# Patient Record
Sex: Female | Born: 2000 | Race: White | Hispanic: Yes | Smoking: Never smoker
Health system: Southern US, Community
[De-identification: ages and names within clinical notes are randomized; demographics above are authoritative.]

## PROBLEM LIST (undated history)

## (undated) ENCOUNTER — Inpatient Hospital Stay (HOSPITAL_COMMUNITY): Payer: Self-pay

---
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
## 2020-03-02 ENCOUNTER — Encounter (HOSPITAL_COMMUNITY): Payer: Self-pay | Admitting: Urgent Care

## 2020-03-02 ENCOUNTER — Ambulatory Visit (HOSPITAL_COMMUNITY)
Admission: EM | Admit: 2020-03-02 | Discharge: 2020-03-02 | Disposition: A | Discharge status: Home / Self Care | Payer: Medicaid Other | Attending: Urgent Care | Admitting: Urgent Care

## 2020-03-02 DIAGNOSIS — N390 Urinary tract infection, site not specified: Secondary | ICD-10-CM

## 2020-03-02 DIAGNOSIS — N3001 Acute cystitis with hematuria: Secondary | ICD-10-CM | POA: Diagnosis not present

## 2020-03-02 DIAGNOSIS — R300 Dysuria: Secondary | ICD-10-CM | POA: Diagnosis present

## 2020-03-02 DIAGNOSIS — Z3202 Encounter for pregnancy test, result negative: Secondary | ICD-10-CM | POA: Diagnosis not present

## 2020-03-02 DIAGNOSIS — R1030 Lower abdominal pain, unspecified: Secondary | ICD-10-CM | POA: Insufficient documentation

## 2020-03-02 LAB — POCT URINALYSIS DIPSTICK, ED / UC
Glucose, UA: NEGATIVE mg/dL
Ketones, ur: 40 mg/dL — AB
Nitrite: NEGATIVE
Protein, ur: >=300 mg/dL — AB
Specific Gravity, Urine: 1.025 (ref 1.005–1.030)
Urobilinogen, UA: 4 mg/dL — ABNORMAL HIGH (ref 0.0–1.0)
pH: 7 (ref 5.0–8.0)

## 2020-03-02 LAB — POC URINE PREG, ED
Preg Test, Ur: NEGATIVE
Preg Test, Ur: NEGATIVE

## 2020-03-02 MED ORDER — NITROFURANTOIN MONOHYD MACRO 100 MG PO CAPS: 100.0000 mg | ORAL_CAPSULE | Freq: Two times a day (BID) | ORAL | 0 refills | Status: DC

## 2020-03-02 NOTE — ED Provider Notes (Signed)
[facility]   MRN: [id number] DOB: 2000/07/15  Subjective:   Jasmine Walters is a 19 y.o. female presenting for 2-day history of acute onset dysuria, urinary frequency, intermittent lower abdominal pain.  Patient would like to be checked for STIs as well.  Admits that she does not hydrate very well, drinks sodas as well.  States that she cleans her vagina internally using her finger.  Denies taking chronic medications.    No Known Allergies  History reviewed. No pertinent past medical history.   History reviewed. No pertinent surgical history.  Family History  Problem Relation Age of Onset  . Healthy Mother   . Healthy Father     Social History   Tobacco Use  . Smoking status: Former Smoker    Types: Cigars  . Smokeless tobacco: Never Used  Substance Use Topics  . Alcohol use: Yes    Comment: occ  . Drug use: Yes    Types: Marijuana    ROS   Objective:   Vitals: BP (!) 96/56   Pulse (!) 59   Temp 98.6 F (37 C) (Oral)   Resp 16   Ht 4\' 11"  (1.499 m)   Wt 98 lb (44.5 kg)   SpO2 100%   BMI 19.79 kg/m   Physical Exam Constitutional:      General: She is not in acute distress.    Appearance: Normal appearance. She is well-developed. She is not ill-appearing, toxic-appearing or diaphoretic.  HENT:     Head: Normocephalic and atraumatic.     Nose: Nose normal.     Mouth/Throat:     Mouth: Mucous membranes are moist.     Pharynx: Oropharynx is clear.  Eyes:     General: No scleral icterus.       Right eye: No discharge.        Left eye: No discharge.     Extraocular Movements: Extraocular movements intact.     Conjunctiva/sclera: Conjunctivae normal.     Pupils: Pupils are equal, round, and reactive to light.  Cardiovascular:     Rate and Rhythm: Normal rate.  Pulmonary:     Effort: Pulmonary effort is normal.  Abdominal:     General: Bowel sounds are normal. There is no distension.     Palpations: Abdomen is soft. There is no  mass.     Tenderness: There is abdominal tenderness (Mild, generalized, throughout). There is no right CVA tenderness, left CVA tenderness, guarding or rebound.  Skin:    General: Skin is warm and dry.  Neurological:     General: No focal deficit present.     Mental Status: She is alert and oriented to person, place, and time.  Psychiatric:        Mood and Affect: Mood normal.        Behavior: Behavior normal.        Thought Content: Thought content normal.        Judgment: Judgment normal.     Results for orders placed or performed during the hospital encounter of 03/02/20 (from the past 24 hour(s))  POC Urinalysis dipstick     Status: Abnormal   Collection Time: 03/02/20  3:52 PM  Result Value Ref Range   Glucose, UA NEGATIVE NEGATIVE mg/dL   Bilirubin Urine MODERATE (A) NEGATIVE   Ketones, ur 40 (A) NEGATIVE mg/dL   Specific Gravity, Urine 1.025 1.005 - 1.030   Hgb urine dipstick LARGE (A) NEGATIVE   pH 7.0 5.0 -  8.0   Protein, ur >=300 (A) NEGATIVE mg/dL   Urobilinogen, UA 4.0 (H) 0.0 - 1.0 mg/dL   Nitrite NEGATIVE NEGATIVE   Leukocytes,Ua LARGE (A) NEGATIVE  POC urine pregnancy     Status: None   Collection Time: 03/02/20  3:54 PM  Result Value Ref Range   Preg Test, Ur Negative Negative    Assessment and Plan :   PDMP not reviewed this encounter.  1. Acute cystitis with hematuria   2. Dysuria   3. Lower abdominal pain     Start Macrobid to cover for acute cystitis, urine culture, STI testing pending.  Discussed appropriate hygiene for the genital area.  Recommended aggressive hydration, limiting urinary irritants. Counseled patient on potential for adverse effects with medications prescribed/recommended today, ER and return-to-clinic precautions discussed, patient verbalized understanding.    Wallis Bamberg, PA-C 03/02/20 1606

## 2020-03-02 NOTE — Discharge Instructions (Signed)
Make sure you hydrate very well with plain water and a quantity of 64 ounces of water a day.  Please limit drinks that are considered urinary irritants such as soda, sweet tea, coffee, energy drinks, alcohol.  These can worsen your UTI symptoms and also be the source of them.  I will let you know about your urine culture results through MyChart to see if we need to change your antibiotics based off of those results. ° °

## 2020-03-02 NOTE — ED Triage Notes (Signed)
Pt c/o dysuria, urine frequency, and 6/10 pain in lower abdomenx2 days

## 2020-03-03 LAB — URINE CULTURE: Culture: >=10000 — AB

## 2020-03-03 LAB — CERVICOVAGINAL ANCILLARY ONLY
Bacterial Vaginitis (gardnerella): POSITIVE — AB
Candida Glabrata: NEGATIVE
Candida Vaginitis: POSITIVE — AB
Chlamydia: NEGATIVE
Comment: NEGATIVE
Comment: NEGATIVE
Comment: NEGATIVE
Comment: NEGATIVE
Comment: NEGATIVE
Comment: NORMAL
Neisseria Gonorrhea: NEGATIVE
Trichomonas: NEGATIVE

## 2020-03-04 ENCOUNTER — Telehealth (HOSPITAL_COMMUNITY): Payer: Self-pay

## 2020-03-04 MED ORDER — FLUCONAZOLE 150 MG PO TABS
150.0000 mg | ORAL_TABLET | Freq: Every day | ORAL | 0 refills | Status: AC
Start: 2020-03-04 — End: 2020-03-06

## 2020-03-04 MED ORDER — METRONIDAZOLE 500 MG PO TABS
500.0000 mg | ORAL_TABLET | Freq: Two times a day (BID) | ORAL | 0 refills | Status: DC
Start: 2020-03-04 — End: 2020-10-08

## 2020-10-08 ENCOUNTER — Encounter (HOSPITAL_COMMUNITY): Payer: Self-pay | Admitting: Urgent Care

## 2020-10-08 ENCOUNTER — Ambulatory Visit (HOSPITAL_COMMUNITY)
Admission: EM | Admit: 2020-10-08 | Discharge: 2020-10-08 | Disposition: A | Discharge status: Home / Self Care | Payer: Medicaid Other | Attending: Urgent Care | Admitting: Urgent Care

## 2020-10-08 ENCOUNTER — Other Ambulatory Visit: Payer: Self-pay

## 2020-10-08 DIAGNOSIS — R300 Dysuria: Secondary | ICD-10-CM | POA: Diagnosis present

## 2020-10-08 DIAGNOSIS — N898 Other specified noninflammatory disorders of vagina: Secondary | ICD-10-CM | POA: Insufficient documentation

## 2020-10-08 DIAGNOSIS — N760 Acute vaginitis: Secondary | ICD-10-CM | POA: Insufficient documentation

## 2020-10-08 DIAGNOSIS — B9689 Other specified bacterial agents as the cause of diseases classified elsewhere: Secondary | ICD-10-CM | POA: Diagnosis present

## 2020-10-08 LAB — POCT URINALYSIS DIPSTICK, ED / UC
Bilirubin Urine: NEGATIVE
Glucose, UA: NEGATIVE mg/dL
Hgb urine dipstick: NEGATIVE
Ketones, ur: NEGATIVE mg/dL
Leukocytes,Ua: NEGATIVE
Nitrite: NEGATIVE
Protein, ur: NEGATIVE mg/dL
Specific Gravity, Urine: 1.025 (ref 1.005–1.030)
Urobilinogen, UA: 0.2 mg/dL (ref 0.0–1.0)
pH: 7 (ref 5.0–8.0)

## 2020-10-08 LAB — HIV ANTIBODY (ROUTINE TESTING W REFLEX): HIV Screen 4th Generation wRfx: NONREACTIVE

## 2020-10-08 LAB — RPR: RPR Ser Ql: NONREACTIVE

## 2020-10-08 LAB — POC URINE PREG, ED: Preg Test, Ur: NEGATIVE

## 2020-10-08 MED ORDER — PHENAZOPYRIDINE HCL 200 MG PO TABS
200.0000 mg | ORAL_TABLET | Freq: Three times a day (TID) | ORAL | 0 refills | Status: DC | PRN
Start: 2020-10-08 — End: 2020-10-19

## 2020-10-08 MED ORDER — METRONIDAZOLE 500 MG PO TABS: 500.0000 mg | ORAL_TABLET | Freq: Two times a day (BID) | ORAL | 0 refills | Status: DC

## 2020-10-08 NOTE — ED Triage Notes (Signed)
PT reports UTI  Sx's with dysuria and a Vag discharge with odor. Pt also wants blood work for HIV.

## 2020-10-08 NOTE — ED Provider Notes (Signed)
[facility]   MRN: [id number] DOB: [birth year]  Subjective:   Jasmine Walters is a 20 y.o. female presenting for several day history of recurrent dysuria, urinary frequency and malodorous vaginal discharge.  Denies fever, nausea, vomiting, abdominal pelvic pain, flank pain, hematuria.  Patient has a history of BV and yeast infections.  She is sexually active, has 1 female partner, does not use any kind of protection.  Would like to make sure she does not have any form of STI including HIV and syphilis.  No current facility-administered medications for this encounter.  Current Outpatient Medications:  .  metroNIDAZOLE (FLAGYL) 500 MG tablet, Take 1 tablet (500 mg total) by mouth 2 (two) times daily., Disp: 14 tablet, Rfl: 0 .  nitrofurantoin, macrocrystal-monohydrate, (MACROBID) 100 MG capsule, Take 1 capsule (100 mg total) by mouth 2 (two) times daily., Disp: 14 capsule, Rfl: 0   No Known Allergies  History reviewed. No pertinent past medical history.   History reviewed. No pertinent surgical history.  Family History  Problem Relation Age of Onset  . Healthy Mother   . Healthy Father     Social History   Tobacco Use  . Smoking status: Former Smoker    Types: Cigars  . Smokeless tobacco: Never Used  Substance Use Topics  . Alcohol use: Yes    Comment: occ  . Drug use: Yes    Types: Marijuana    ROS   Objective:   Vitals: BP 106/62 (BP Location: Right Arm)   Pulse 63   Temp 98.5 F (36.9 C) (Oral)   Resp 16   LMP 09/19/2020   SpO2 100%   Physical Exam Constitutional:      General: She is not in acute distress.    Appearance: Normal appearance. She is well-developed. She is not ill-appearing, toxic-appearing or diaphoretic.  HENT:     Head: Normocephalic and atraumatic.     Nose: Nose normal.     Mouth/Throat:     Mouth: Mucous membranes are moist.     Pharynx: Oropharynx is clear.  Eyes:     General: No scleral icterus.    Extraocular  Movements: Extraocular movements intact.     Pupils: Pupils are equal, round, and reactive to light.  Cardiovascular:     Rate and Rhythm: Normal rate.  Pulmonary:     Effort: Pulmonary effort is normal.  Abdominal:     General: Bowel sounds are normal. There is no distension.     Palpations: Abdomen is soft. There is no mass.     Tenderness: There is no abdominal tenderness. There is no right CVA tenderness, left CVA tenderness, guarding or rebound.  Skin:    General: Skin is warm and dry.  Neurological:     General: No focal deficit present.     Mental Status: She is alert and oriented to person, place, and time.  Psychiatric:        Mood and Affect: Mood normal.        Behavior: Behavior normal.        Thought Content: Thought content normal.        Judgment: Judgment normal.     Results for orders placed or performed during the hospital encounter of 10/08/20 (from the past 24 hour(s))  POC Urinalysis dipstick     Status: None   Collection Time: 10/08/20  8:46 AM  Result Value Ref Range   Glucose, UA NEGATIVE NEGATIVE mg/dL   Bilirubin Urine NEGATIVE NEGATIVE  Ketones, ur NEGATIVE NEGATIVE mg/dL   Specific Gravity, Urine 1.025 1.005 - 1.030   Hgb urine dipstick NEGATIVE NEGATIVE   pH 7.0 5.0 - 8.0   Protein, ur NEGATIVE NEGATIVE mg/dL   Urobilinogen, UA 0.2 0.0 - 1.0 mg/dL   Nitrite NEGATIVE NEGATIVE   Leukocytes,Ua NEGATIVE NEGATIVE  POC urine pregnancy     Status: None   Collection Time: 10/08/20  8:50 AM  Result Value Ref Range   Preg Test, Ur NEGATIVE NEGATIVE    Assessment and Plan :   PDMP not reviewed this encounter.  1. BV (bacterial vaginosis)   2. Dysuria   3. Vaginal discharge     Patient has benign physical exam, reassuring vital signs.  Will cover empirically for recurrent bacterial vaginosis.  Encouraged safe sex practices, labs pending will treat as appropriate.  In the meantime, hydrate well with plain water, use Pyridium as needed, urine  culture pending. Counseled patient on potential for adverse effects with medications prescribed/recommended today, ER and return-to-clinic precautions discussed, patient verbalized understanding.    Wallis Bamberg, [place] 10/08/20 [phone]

## 2020-10-09 LAB — URINE CULTURE: Culture: NO GROWTH

## 2020-10-09 LAB — CERVICOVAGINAL ANCILLARY ONLY
Bacterial Vaginitis (gardnerella): POSITIVE — AB
Candida Glabrata: NEGATIVE
Candida Vaginitis: NEGATIVE
Chlamydia: NEGATIVE
Comment: NEGATIVE
Comment: NEGATIVE
Comment: NEGATIVE
Comment: NEGATIVE
Comment: NEGATIVE
Comment: NORMAL
Neisseria Gonorrhea: NEGATIVE
Trichomonas: NEGATIVE

## 2020-10-19 ENCOUNTER — Other Ambulatory Visit: Payer: Self-pay

## 2020-10-19 ENCOUNTER — Encounter (HOSPITAL_COMMUNITY): Payer: Self-pay | Admitting: Medical Oncology

## 2020-10-19 ENCOUNTER — Ambulatory Visit (HOSPITAL_COMMUNITY)
Admission: EM | Admit: 2020-10-19 | Discharge: 2020-10-19 | Disposition: A | Discharge status: Home / Self Care | Payer: Medicaid Other | Attending: Medical Oncology | Admitting: Medical Oncology

## 2020-10-19 DIAGNOSIS — R519 Headache, unspecified: Secondary | ICD-10-CM | POA: Insufficient documentation

## 2020-10-19 DIAGNOSIS — Z87891 Personal history of nicotine dependence: Secondary | ICD-10-CM | POA: Diagnosis not present

## 2020-10-19 DIAGNOSIS — R059 Cough, unspecified: Secondary | ICD-10-CM

## 2020-10-19 DIAGNOSIS — R0981 Nasal congestion: Secondary | ICD-10-CM | POA: Diagnosis not present

## 2020-10-19 DIAGNOSIS — R112 Nausea with vomiting, unspecified: Secondary | ICD-10-CM | POA: Diagnosis not present

## 2020-10-19 DIAGNOSIS — Z20822 Contact with and (suspected) exposure to covid-19: Secondary | ICD-10-CM | POA: Insufficient documentation

## 2020-10-19 MED ORDER — BENZONATATE 100 MG PO CAPS: 100.0000 mg | ORAL_CAPSULE | Freq: Three times a day (TID) | ORAL | 0 refills | Status: DC

## 2020-10-19 MED ORDER — FLUTICASONE PROPIONATE 50 MCG/ACT NA SUSP
2.0000 | Freq: Every day | NASAL | 0 refills | Status: DC
Start: 2020-10-19 — End: 2022-04-30

## 2020-10-19 MED ORDER — ONDANSETRON HCL 4 MG PO TABS: 4.0000 mg | ORAL_TABLET | Freq: Four times a day (QID) | ORAL | 0 refills | Status: DC

## 2020-10-19 NOTE — ED Triage Notes (Addendum)
Pt with headache, cough, mucus, body aches since yesterday. Rates pain in head 8/10. One episode of emesis. Has been using nyquil for sxs.

## 2020-10-19 NOTE — ED Provider Notes (Signed)
[facility]    CSN: [id number] Arrival date & time: 10/19/20  1537      History   Chief Complaint Chief Complaint  Patient presents with  . Headache  . Cough  . Nasal Congestion  . Generalized Body Aches    HPI Jasmine Walters is a 20 y.o. female.   HPI   Cold Symptoms: Patient reports with headache, cough, sinus congestion, body aches that started yesterday.  Headache is rated 8 out of 10 in nature.  They have had 1 episode of emesis which looks like her last meal.  She has been using NyQuil for symptoms with mild relief. She denies fevers, neck stiffness, double vision, visual changes, abd pain. LMC: Current and has female partner.   History reviewed. No pertinent past medical history.  There are no problems to display for this patient.   History reviewed. No pertinent surgical history.  OB History   No obstetric history on file.      Home Medications    Prior to Admission medications   Medication Sig Start Date End Date Taking? Authorizing Provider  metroNIDAZOLE (FLAGYL) 500 MG tablet Take 1 tablet (500 mg total) by mouth 2 (two) times daily. 10/08/20   Wallis Bamberg, PA-C  nitrofurantoin, macrocrystal-monohydrate, (MACROBID) 100 MG capsule Take 1 capsule (100 mg total) by mouth 2 (two) times daily. 03/02/20   Wallis Bamberg, PA-C  phenazopyridine (PYRIDIUM) 200 MG tablet Take 1 tablet (200 mg total) by mouth 3 (three) times daily as needed for pain. 10/08/20   Wallis Bamberg, PA-C  cetirizine (ZYRTEC ALLERGY) 10 MG tablet Take 1 tablet (10 mg total) by mouth daily. 10/22/19 03/02/20  Wallis Bamberg, PA-C  famotidine (PEPCID) 20 MG tablet Take bid for 3 days then as needed thereafter for heartburn 04/18/18 04/25/19  Ree Shay, MD  fluticasone (FLONASE) 50 MCG/ACT nasal spray Place 2 sprays into both nostrils daily. 05/31/16 04/25/19  Everlene Farrier, PA-C  omeprazole (PRILOSEC) 20 MG capsule Take 1 capsule (20 mg total) by mouth daily. 09/07/17 04/25/19  Antony Madura, PA-C     Family History Family History  Problem Relation Age of Onset  . Healthy Mother   . Healthy Father     Social History Social History   Tobacco Use  . Smoking status: Former Smoker    Types: Cigars  . Smokeless tobacco: Never Used  Substance Use Topics  . Alcohol use: Yes    Comment: occ  . Drug use: Yes    Types: Marijuana     Allergies   Patient has no known allergies.   Review of Systems Review of Systems  As stated above in HPI Physical Exam Triage Vital Signs ED Triage Vitals  Enc Vitals Group     BP 10/19/20 1630 100/62     Pulse Rate 10/19/20 1630 78     Resp 10/19/20 1630 18     Temp 10/19/20 1630 98.9 F (37.2 C)     Temp src --      SpO2 10/19/20 1630 98 %     Weight --      Height --      Head Circumference --      Peak Flow --      Pain Score 10/19/20 1627 8     Pain Loc --      Pain Edu? --      Excl. in GC? --    No data found.  Updated Vital Signs BP 100/62   Pulse 78  Temp 98.9 F (37.2 C)   Resp 18   LMP 10/17/2020   SpO2 98%   Physical Exam Vitals and nursing note reviewed.  Constitutional:      General: She is not in acute distress.    Appearance: She is well-developed. She is not ill-appearing, toxic-appearing or diaphoretic.  HENT:     Head: Normocephalic and atraumatic.     Mouth/Throat:     Mouth: Mucous membranes are moist.     Pharynx: Oropharynx is clear.  Eyes:     Extraocular Movements: Extraocular movements intact.     Right eye: Normal extraocular motion and no nystagmus.     Left eye: Normal extraocular motion and no nystagmus.     Pupils: Pupils are equal, round, and reactive to light.     Right eye: Pupil is round and reactive.     Left eye: Pupil is round and reactive.  Cardiovascular:     Rate and Rhythm: Normal rate and regular rhythm.     Heart sounds: Normal heart sounds.  Pulmonary:     Breath sounds: Normal breath sounds.  Abdominal:     Palpations: Abdomen is soft.  Musculoskeletal:      Cervical back: Normal range of motion and neck supple. No rigidity.  Lymphadenopathy:     Cervical: No cervical adenopathy.  Skin:    General: Skin is warm.     Coloration: Skin is not cyanotic.     Comments: NO jaundice  Neurological:     Mental Status: She is alert and oriented to person, place, and time.     Motor: No weakness.     Coordination: Coordination normal.     Gait: Gait normal.     Deep Tendon Reflexes: Reflexes normal.  Psychiatric:        Behavior: Behavior normal.      UC Treatments / Results  Labs (all labs ordered are listed, but only abnormal results are displayed) Labs Reviewed  SARS CORONAVIRUS 2 (TAT 6-24 HRS)    EKG   Radiology No results found.  Procedures Procedures (including critical care time)  Medications Ordered in UC Medications - No data to display  Initial Impression / Assessment and Plan / UC Course  I have reviewed the triage vital signs and the nursing notes.  Pertinent labs & imaging results that were available during my care of the patient were reviewed by me and considered in my medical decision making (see chart for details).     New.  Likely viral in nature.  COVID test pending.  Other precautions discussed.  For now we will treat with Flonase, Tessalon and Zofran.  Discussed red flag signs and symptoms.  Final Clinical Impressions(s) / UC Diagnoses   Final diagnoses:  None   Discharge Instructions   None    ED Prescriptions    None     PDMP not reviewed this encounter.   Rushie Chestnut, [place] 10/19/20 1645

## 2020-10-20 LAB — SARS CORONAVIRUS 2 (TAT 6-24 HRS): SARS Coronavirus 2: NEGATIVE

## 2020-10-23 ENCOUNTER — Encounter (HOSPITAL_COMMUNITY): Payer: Self-pay

## 2020-10-23 ENCOUNTER — Ambulatory Visit (HOSPITAL_COMMUNITY)
Admission: EM | Admit: 2020-10-23 | Discharge: 2020-10-23 | Disposition: A | Discharge status: Home / Self Care | Payer: Medicaid Other | Attending: Internal Medicine | Admitting: Internal Medicine

## 2020-10-23 ENCOUNTER — Other Ambulatory Visit: Payer: Self-pay

## 2020-10-23 DIAGNOSIS — J069 Acute upper respiratory infection, unspecified: Secondary | ICD-10-CM

## 2020-10-23 MED ORDER — GUAIFENESIN ER 600 MG PO TB12
600.0000 mg | ORAL_TABLET | Freq: Two times a day (BID) | ORAL | 0 refills | Status: DC | PRN
Start: 2020-10-23 — End: 2022-04-30

## 2020-10-23 NOTE — ED Triage Notes (Signed)
Pt c/o wheezing and cough. Pt states she has taken her medicine and states the medication is causing her to not eat.

## 2020-10-23 NOTE — Discharge Instructions (Addendum)
Take medications as tolerated No indication for steroids or inhalers at this time If symptoms worsen please return to the urgent care to be reevaluated.

## 2020-10-26 NOTE — ED Provider Notes (Signed)
[facility]    CSN: [id number] Arrival date & time: 10/23/20  1349      History   Chief Complaint Chief Complaint  Patient presents with  . Wheezing  . Cough    HPI Jasmine Walters is a 20 y.o. female comes to the urgent care with 1 day history of intermittent wheezing and coughing.  Symptoms started with some nasal congestion,.  No fever or chills.  No nausea or vomiting.  Appetite is decreased.  No sick contacts.   HPI  History reviewed. No pertinent past medical history.  There are no problems to display for this patient.   History reviewed. No pertinent surgical history.  OB History   No obstetric history on file.      Home Medications    Prior to Admission medications   Medication Sig Start Date End Date Taking? Authorizing Provider  guaiFENesin (MUCINEX) 600 MG 12 hr tablet Take 1 tablet (600 mg total) by mouth 2 (two) times daily as needed. 10/23/20  Yes Gracianna Vink, Britta Mccreedy, MD  benzonatate (TESSALON) 100 MG capsule Take 1 capsule (100 mg total) by mouth every 8 (eight) hours. 10/19/20   Rushie Chestnut, PA-C  fluticasone (FLONASE) 50 MCG/ACT nasal spray Place 2 sprays into both nostrils daily. 10/19/20   Rushie Chestnut, PA-C  ondansetron (ZOFRAN) 4 MG tablet Take 1 tablet (4 mg total) by mouth every 6 (six) hours. 10/19/20   Rushie Chestnut, PA-C  cetirizine (ZYRTEC ALLERGY) 10 MG tablet Take 1 tablet (10 mg total) by mouth daily. 10/22/19 03/02/20  Wallis Bamberg, PA-C  famotidine (PEPCID) 20 MG tablet Take bid for 3 days then as needed thereafter for heartburn 04/18/18 04/25/19  Ree Shay, MD  omeprazole (PRILOSEC) 20 MG capsule Take 1 capsule (20 mg total) by mouth daily. 09/07/17 04/25/19  Antony Madura, PA-C    Family History Family History  Problem Relation Age of Onset  . Healthy Mother   . Healthy Father     Social History Social History   Tobacco Use  . Smoking status: Former Smoker    Types: Cigars  . Smokeless tobacco: Never Used   Substance Use Topics  . Alcohol use: Yes    Comment: occ  . Drug use: Yes    Types: Marijuana     Allergies   Patient has no known allergies.   Review of Systems Review of Systems  Constitutional: Negative.   HENT: Positive for congestion and sore throat.   Respiratory: Positive for cough.   Gastrointestinal: Negative.  Negative for diarrhea, nausea and vomiting.     Physical Exam Triage Vital Signs ED Triage Vitals  Enc Vitals Group     BP 10/23/20 1400 103/65     Pulse Rate 10/23/20 1400 78     Resp 10/23/20 1400 18     Temp 10/23/20 1400 98.7 F (37.1 C)     Temp Source 10/23/20 1400 Oral     SpO2 10/23/20 1400 97 %     Weight --      Height --      Head Circumference --      Peak Flow --      Pain Score 10/23/20 1359 0     Pain Loc --      Pain Edu? --      Excl. in GC? --    No data found.  Updated Vital Signs BP 103/65 (BP Location: Right Arm)   Pulse 78   Temp 98.7 F (  37.1 C) (Oral)   Resp 18   LMP 10/17/2020   SpO2 97%   Visual Acuity Right Eye Distance:   Left Eye Distance:   Bilateral Distance:    Right Eye Near:   Left Eye Near:    Bilateral Near:     Physical Exam Vitals and nursing note reviewed.  Constitutional:      General: She is not in acute distress.    Appearance: She is not ill-appearing.  Cardiovascular:     Rate and Rhythm: Normal rate and regular rhythm.     Heart sounds: Normal heart sounds.  Pulmonary:     Effort: Pulmonary effort is normal.     Breath sounds: Normal breath sounds.      UC Treatments / Results  Labs (all labs ordered are listed, but only abnormal results are displayed) Labs Reviewed - No data to display  EKG   Radiology No results found.  Procedures Procedures (including critical care time)  Medications Ordered in UC Medications - No data to display  Initial Impression / Assessment and Plan / UC Course  I have reviewed the triage vital signs and the nursing notes.  Pertinent  labs & imaging results that were available during my care of the patient were reviewed by me and considered in my medical decision making (see chart for details).     1.  Viral respiratory illness with cough: Continue over-the-counter medications for cough Mucinex as needed Increase oral fluid intake Return to urgent care if symptoms persist.  Final Clinical Impressions(s) / UC Diagnoses   Final diagnoses:  Viral upper respiratory tract infection with cough     Discharge Instructions     Take medications as tolerated No indication for steroids or inhalers at this time If symptoms worsen please return to the urgent care to be reevaluated.   ED Prescriptions    Medication Sig Dispense Auth. Provider   guaiFENesin (MUCINEX) 600 MG 12 hr tablet Take 1 tablet (600 mg total) by mouth 2 (two) times daily as needed. 20 tablet Daxton Nydam, Britta Mccreedy, MD     PDMP not reviewed this encounter.   Merrilee Jansky, MD 10/26/20 [phone]

## 2021-08-28 ENCOUNTER — Encounter (HOSPITAL_COMMUNITY): Payer: Self-pay

## 2021-08-28 ENCOUNTER — Emergency Department (HOSPITAL_COMMUNITY)
Admission: EM | Admit: 2021-08-28 | Discharge: 2021-08-29 | Disposition: A | Discharge status: Home / Self Care | Payer: Medicaid Other | Attending: Emergency Medicine | Admitting: Emergency Medicine

## 2021-08-28 ENCOUNTER — Encounter (HOSPITAL_COMMUNITY): Payer: Self-pay | Admitting: *Deleted

## 2021-08-28 ENCOUNTER — Other Ambulatory Visit: Payer: Self-pay

## 2021-08-28 ENCOUNTER — Ambulatory Visit (HOSPITAL_COMMUNITY)
Admission: EM | Admit: 2021-08-28 | Discharge: 2021-08-28 | Disposition: A | Discharge status: Home / Self Care | Payer: Medicaid Other

## 2021-08-28 DIAGNOSIS — D72829 Elevated white blood cell count, unspecified: Secondary | ICD-10-CM | POA: Insufficient documentation

## 2021-08-28 DIAGNOSIS — R202 Paresthesia of skin: Secondary | ICD-10-CM

## 2021-08-28 DIAGNOSIS — R200 Anesthesia of skin: Secondary | ICD-10-CM

## 2021-08-28 DIAGNOSIS — R208 Other disturbances of skin sensation: Secondary | ICD-10-CM

## 2021-08-28 LAB — CBC WITH DIFFERENTIAL/PLATELET
Abs Immature Granulocytes: 0.04 10*3/uL (ref 0.00–0.07)
Basophils Absolute: 0.1 10*3/uL (ref 0.0–0.1)
Basophils Relative: 1 %
Eosinophils Absolute: 0.1 10*3/uL (ref 0.0–0.5)
Eosinophils Relative: 1 %
HCT: 45 % (ref 36.0–46.0)
Hemoglobin: 15 g/dL (ref 12.0–15.0)
Immature Granulocytes: 0 %
Lymphocytes Relative: 39 %
Lymphs Abs: 4.2 10*3/uL — ABNORMAL HIGH (ref 0.7–4.0)
MCH: 31.8 pg (ref 26.0–34.0)
MCHC: 33.3 g/dL (ref 30.0–36.0)
MCV: 95.5 fL (ref 80.0–100.0)
Monocytes Absolute: 0.6 10*3/uL (ref 0.1–1.0)
Monocytes Relative: 6 %
Neutro Abs: 5.7 10*3/uL (ref 1.7–7.7)
Neutrophils Relative %: 53 %
Platelets: 279 10*3/uL (ref 150–400)
RBC: 4.71 MIL/uL (ref 3.87–5.11)
RDW: 13.4 % (ref 11.5–15.5)
WBC: 10.7 10*3/uL — ABNORMAL HIGH (ref 4.0–10.5)
nRBC: 0 % (ref 0.0–0.2)

## 2021-08-28 LAB — I-STAT BETA HCG BLOOD, ED (MC, WL, AP ONLY): I-stat hCG, quantitative: <5 m[IU]/mL (ref <5)

## 2021-08-28 NOTE — ED Provider Notes (Incomplete)
[facility]    CSN: [id number] Arrival date & time: 08/28/21  1842      History   Chief Complaint No chief complaint on file.   HPI Zamyah Wiesman is a 21 y.o. female.   HPI Patient presents today with a complaint of awakening with left arm numbness and tingling.  She reports over the remainder of the day her left arm became more more weak and she also had some intermittent episodes of slurring of speech.  She reports no known injury.  She denies any headache or dizziness.  No changes in vision.   No past medical history on file.  There are no problems to display for this patient.   No past surgical history on file.  OB History   No obstetric history on file.      Home Medications    Prior to Admission medications   Medication Sig Start Date End Date Taking? Authorizing Provider  benzonatate (TESSALON) 100 MG capsule Take 1 capsule (100 mg total) by mouth every 8 (eight) hours. 10/19/20   Rushie Chestnut, PA-C  fluticasone (FLONASE) 50 MCG/ACT nasal spray Place 2 sprays into both nostrils daily. 10/19/20   Rushie Chestnut, PA-C  guaiFENesin (MUCINEX) 600 MG 12 hr tablet Take 1 tablet (600 mg total) by mouth 2 (two) times daily as needed. 10/23/20   Lamptey, Britta Mccreedy, MD  ondansetron (ZOFRAN) 4 MG tablet Take 1 tablet (4 mg total) by mouth every 6 (six) hours. 10/19/20   Rushie Chestnut, PA-C  cetirizine (ZYRTEC ALLERGY) 10 MG tablet Take 1 tablet (10 mg total) by mouth daily. 10/22/19 03/02/20  Wallis Bamberg, PA-C  famotidine (PEPCID) 20 MG tablet Take bid for 3 days then as needed thereafter for heartburn 04/18/18 04/25/19  Ree Shay, MD  omeprazole (PRILOSEC) 20 MG capsule Take 1 capsule (20 mg total) by mouth daily. 09/07/17 04/25/19  Antony Madura, PA-C    Family History Family History  Problem Relation Age of Onset   Healthy Mother    Healthy Father     Social History Social History   Tobacco Use   Smoking status: Former    Types: Cigars   Smokeless  tobacco: Never  Substance Use Topics   Alcohol use: Yes    Comment: occ   Drug use: Yes    Types: Marijuana     Allergies   Patient has no known allergies.   Review of Systems Review of Systems Pertinent negatives listed in HPI   Physical Exam Triage Vital Signs ED Triage Vitals  Enc Vitals Group     BP      Pulse      Resp      Temp      Temp src      SpO2      Weight      Height      Head Circumference      Peak Flow      Pain Score      Pain Loc      Pain Edu?      Excl. in GC?    No data found.  Updated Vital Signs There were no vitals taken for this visit.  Visual Acuity Right Eye Distance:   Left Eye Distance:   Bilateral Distance:    Right Eye Near:   Left Eye Near:    Bilateral Near:     Physical Exam Neurological:     Mental Status: She is oriented to person,  place, and time.     GCS: GCS eye subscore is 4. GCS verbal subscore is 5. GCS motor subscore is 6.     Motor: Motor function is intact.     Comments: Left hand grip weakened compared to right hand      UC Treatments / Results  Labs (all labs ordered are listed, but only abnormal results are displayed) Labs Reviewed - No data to display  EKG   Radiology No results found.  Procedures Procedures (including critical care time)  Medications Ordered in UC Medications - No data to display  Initial Impression / Assessment and Plan / UC Course  I have reviewed the triage vital signs and the nursing notes.  Pertinent labs & imaging results that were available during my care of the patient were reviewed by me and considered in my medical decision making (see chart for details).    Patient advised to go to the emergency department for further work-up and evaluation given her symptoms.  Patient appears well neurologically stable however she is insistent that her left arm is numb and weak and she has no sensation.  Advised to follow-up with [facility] emergency department  Final  Clinical Impressions(s) / UC Diagnoses   Final diagnoses:  Numbness and tingling in left arm  Decreased sensation of hand and arm   Discharge Instructions   None    ED Prescriptions   None    PDMP not reviewed this encounter.

## 2021-08-28 NOTE — Discharge Instructions (Signed)
Given exam findings and your symptoms today recommend further work-up and evaluation in the setting of the emergency department given that you have lost complete sensation in your left arm and hand and experiencing numbness and weakness. ?

## 2021-08-28 NOTE — ED Provider Notes (Signed)
[facility] EMERGENCY DEPARTMENT Provider Note   CSN: [id number] Arrival date & time: 08/28/21  2008     History  Chief Complaint  Patient presents with   Arm Pain    Right arm pain    Jasmine Walters is a 21 y.o. female.  Patient presents to the emergency department for evaluation of right arm numbness and tingling and also pain in starting this morning.  Patient states that she woke up today and noticed the symptoms around noon.  She describes numbness, without sensation on the dorsum of the hand and radial nerve distribution.  She has more tingling on the palm of the hand and the median and ulnar nerve distribution.  Also tingling and pain in her forearm.  She states that she feels weak but is able to move her upper extremity.  No wrist drop reported.  She denies injuries and states that she felt normal when she went to bed around 2 AM.  Patient went to urgent care.  She was referred to the emergency department for further evaluation.  Patient states that she has had stuttering of speech today which is unusual but no distinct slurred speech or word finding difficulties. No HA, neck pain. Patient denies signs of stroke including: facial droop, aphasia, imbalance/trouble walking.  No h/o MS.        Home Medications Prior to Admission medications   Medication Sig Start Date End Date Taking? Authorizing Provider  benzonatate (TESSALON) 100 MG capsule Take 1 capsule (100 mg total) by mouth every 8 (eight) hours. 10/19/20   Rushie Chestnut, PA-C  fluticasone (FLONASE) 50 MCG/ACT nasal spray Place 2 sprays into both nostrils daily. 10/19/20   Rushie Chestnut, PA-C  guaiFENesin (MUCINEX) 600 MG 12 hr tablet Take 1 tablet (600 mg total) by mouth 2 (two) times daily as needed. 10/23/20   Lamptey, Britta Mccreedy, MD  ondansetron (ZOFRAN) 4 MG tablet Take 1 tablet (4 mg total) by mouth every 6 (six) hours. 10/19/20   Rushie Chestnut, PA-C  cetirizine (ZYRTEC ALLERGY) 10 MG tablet  Take 1 tablet (10 mg total) by mouth daily. 10/22/19 03/02/20  Wallis Bamberg, PA-C  famotidine (PEPCID) 20 MG tablet Take bid for 3 days then as needed thereafter for heartburn 04/18/18 04/25/19  Ree Shay, MD  omeprazole (PRILOSEC) 20 MG capsule Take 1 capsule (20 mg total) by mouth daily. 09/07/17 04/25/19  Antony Madura, PA-C      Allergies    Patient has no known allergies.    Review of Systems   Review of Systems  Physical Exam Updated Vital Signs BP 120/73 (BP Location: Left Arm)    Pulse 87    Temp 98.3 F (36.8 C) (Oral)    Resp 18    Ht 4\' 11"  (1.499 m)    Wt 44.5 kg    LMP 08/28/2021    SpO2 100%    BMI 19.81 kg/m  Physical Exam Vitals and nursing note reviewed.  Constitutional:      Appearance: She is well-developed.  HENT:     Head: Normocephalic and atraumatic.     Right Ear: Tympanic membrane, ear canal and external ear normal.     Left Ear: Tympanic membrane, ear canal and external ear normal.     Nose: Nose normal.     Mouth/Throat:     Pharynx: Uvula midline.  Eyes:     General: Lids are normal.     Extraocular Movements:     Right  eye: No nystagmus.     Left eye: No nystagmus.     Conjunctiva/sclera: Conjunctivae normal.     Pupils: Pupils are equal, round, and reactive to light.  Cardiovascular:     Rate and Rhythm: Normal rate and regular rhythm.  Pulmonary:     Effort: Pulmonary effort is normal.     Breath sounds: Normal breath sounds.  Abdominal:     Palpations: Abdomen is soft.     Tenderness: There is no abdominal tenderness.  Musculoskeletal:        General: Tenderness present. No swelling.     Cervical back: Normal range of motion and neck supple. No tenderness or bony tenderness.  Skin:    General: Skin is warm and dry.  Neurological:     Mental Status: She is alert and oriented to person, place, and time.     GCS: GCS eye subscore is 4. GCS verbal subscore is 5. GCS motor subscore is 6.     Cranial Nerves: No cranial nerve deficit.     Sensory:  No sensory deficit.     Motor: No weakness.     Coordination: Coordination normal.     Gait: Gait normal.     Comments: Overall poor effort with R UE testing because of pain in the arm when she moves. With coaching: upper extremity myotomes tested bilaterally:  C5 Shoulder abduction 5/5 C6 Elbow flexion/wrist extension 5/5 (no wrist drop) C7 Elbow extension 5/5 C8 Finger flexion 5/5 T1 Finger abduction 5/5  Patient is able to stand from a sitting position and walk in the hall without any difficulty.    ED Results / Procedures / Treatments   Labs (all labs ordered are listed, but only abnormal results are displayed) Labs Reviewed  CBC WITH DIFFERENTIAL/PLATELET - Abnormal; Notable for the following components:      Result Value   WBC 10.7 (*)    Lymphs Abs 4.2 (*)    All other components within normal limits  BASIC METABOLIC PANEL  I-STAT BETA HCG BLOOD, ED ([facility], [facility], [facility] ONLY)    EKG None  Radiology DG Shoulder Right  Result Date: 08/29/2021 CLINICAL DATA:  Initial evaluation for acute shoulder pain, right upper extremity numbness. EXAM: RIGHT SHOULDER - 2+ VIEW COMPARISON:  None. FINDINGS: There is no evidence of fracture or dislocation. There is no evidence of arthropathy or other focal bone abnormality. Soft tissues are unremarkable. IMPRESSION: No acute osseous abnormality about the right shoulder. Electronically Signed   By: Rise MuBenjamin  McClintock M.D.   On: 08/29/2021 00:19    Procedures Procedures    Medications Ordered in ED Medications - No data to display  ED Course/ Medical Decision Making/ A&P                           Medical Decision Making Amount and/or Complexity of Data Reviewed Labs: ordered. Radiology: ordered.  Risk Prescription drug management.   Patient seen and examined. History obtained directly from patient and I also reviewed urgent care note.   Labs/EKG: Ordered CBC, BMP, pregnancy.  Imaging: Ordered x-ray of shoulder, considered head CT  but low concern for bleed or mass.   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 Low concern for CVA (hemorrhagic or ischemic), trauma, MS, brain tumor.  1:05 AM Reassessment performed. Patient appears stable.  Right upper extremity exam unchanged.  Labs personally reviewed and interpreted including: CBC with mild leukocytosis otherwise normal; BMP normal; pregnancy negative.  Imaging personally visualized and interpreted including: X-ray of the shoulder, agree no fracture or dislocation.  Reviewed pertinent lab work and imaging with patient at bedside. Questions answered.   Most current vital signs reviewed and are as follows: BP 120/73 (BP Location: Left Arm)    Pulse 87    Temp 98.3 F (36.8 C) (Oral)    Resp 18    Ht 4\' 11"  (1.499 m)    Wt 44.5 kg    LMP 08/29/2021 (Exact Date)    SpO2 100%    BMI 19.81 kg/m   Plan: Discharge to home.   Prescriptions written for: P.o. ibuprofen  Other home care instructions discussed: Rest of the upper extremity  ED return instructions discussed: Patient counseled to return if they have weakness in their arms or legs, slurred speech, trouble walking or talking, confusion, trouble with their balance, or if they have any other concerns. Patient verbalizes understanding and agrees with plan.   Follow-up instructions discussed: Patient encouraged to follow-up with their PCP in 7 days.  Also neurology referral sent in for the patient.  If her symptoms do not improve, she may benefit from their evaluation.     Patient with right upper extremity paresthesias and numbness.  She has pain with movement.  States that her arm hurts with movement.  No headache or neck pain.  No lower  extremity symptoms.  Low concern for CVA, MS, GB, central cord issue at this point.  Symptoms most consistent with a peripheral neuropathy, unclear etiology.          Final Clinical Impression(s) / ED Diagnoses Final diagnoses:  Paresthesia    Rx / DC Orders ED Discharge Orders          Ordered    ibuprofen (ADVIL) 800 MG tablet  3 times daily        08/29/21 0034    Ambulatory referral to Neurology       Comments: An appointment is requested in approximately: 2 weeks for right upper extremity paresthesia   08/29/21 0035              10/29/21, PA-C 08/29/21 0108    10/29/21 Eudelia Bunch, MD 08/29/21 [phone]

## 2021-08-28 NOTE — ED Notes (Signed)
Patient transported to X-ray 
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
## 2021-08-29 ENCOUNTER — Emergency Department (HOSPITAL_COMMUNITY): Payer: Medicaid Other

## 2021-08-29 LAB — BASIC METABOLIC PANEL
Anion gap: 8 (ref 5–15)
BUN: 10 mg/dL (ref 6–20)
CO2: 26 mmol/L (ref 22–32)
Calcium: 9.1 mg/dL (ref 8.9–10.3)
Chloride: 103 mmol/L (ref 98–111)
Creatinine, Ser: 0.68 mg/dL (ref 0.44–1.00)
GFR, Estimated: >60 mL/min (ref >60)
Glucose, Bld: 95 mg/dL (ref 70–99)
Potassium: 4.3 mmol/L (ref 3.5–5.1)
Sodium: 137 mmol/L (ref 135–145)

## 2021-08-29 MED ORDER — IBUPROFEN 800 MG PO TABS: 800.0000 mg | ORAL_TABLET | Freq: Three times a day (TID) | ORAL | 0 refills | Status: DC

## 2021-08-29 NOTE — Discharge Instructions (Signed)
Please read and follow all provided instructions. ? ?Your diagnoses today include:  ?1. Paresthesia   ? ? ?Tests performed today include: ?Complete blood cell count ?Basic metabolic panel ?Pregnancy test (urine or blood, in women only) ?Vital signs. See below for your results today.  ? ?Medications prescribed:  ?Ibuprofen (Motrin, Advil) - anti-inflammatory pain medication ?Do not exceed 600mg  ibuprofen every 6 hours, take with food ? ?You have been prescribed an anti-inflammatory medication or NSAID. Take with food. Take smallest effective dose for the shortest duration needed for your pain. Stop taking if you experience stomach pain or vomiting.  ? ?Take any prescribed medications only as directed. ? ?Home care instructions:  ?Follow any educational materials contained in this packet. ? ?BE VERY CAREFUL not to take multiple medicines containing Tylenol (also called acetaminophen). Doing so can lead to an overdose which can damage your liver and cause liver failure and possibly death.  ? ?Follow-up instructions: ?Please follow-up with your primary care provider in the next 7 days for further evaluation of your symptoms.  I have also ordered a neurology referral and you may be contacted by them early next week.  If you have persistent symptoms, please follow-up to have them evaluate you. ? ?Return instructions:  ?Please return to the Emergency Department if you experience worsening symptoms.  ?Return if you have weakness in your arms or legs, slurred speech, trouble walking or talking, confusion, or trouble with your balance.  ?Please return if you have any other emergent concerns. ? ?Additional Information: ? ?Your vital signs today were: ?BP 120/73 (BP Location: Left Arm)   Pulse 87   Temp 98.3 ?F (36.8 ?C) (Oral)   Resp 18   Ht 4\' 11"  (1.499 m)   Wt 44.5 kg   LMP 08/29/2021 (Exact Date)   SpO2 100%   BMI 19.81 kg/m?  ?If your blood pressure (BP) was elevated above 135/85 this visit, please have this  repeated by your doctor within one month. ?-------------- ? ?

## 2021-08-31 ENCOUNTER — Encounter: Payer: Self-pay | Admitting: Neurology

## 2022-02-18 NOTE — Progress Notes (Deleted)
[facility] Clinic Note - Initial Visit   Date: 02/19/2022   Jasmine Walters MRN: [id number] DOB: 08-23-2000   Dear Renne Crigler, PA-C:  Thank you for your kind referral of Jasmine Walters for consultation of *** hand numbness. Although her history is well known to you, please allow [place] to reiterate it for the purpose of our medical record. The patient was accompanied to the clinic by *** who also provides collateral information.     Jasmine Walters is a 21 y.o. ***-handed female with *** presenting for evaluation of *** hand numbness.   IMPRESSION/PLAN: ****  Return to clinic in ***  ------------------------------------------------------------- History of present illness: ***  Out-side paper records, electronic medical record, and images have been reviewed where available and summarized as: *** No results found for: "HGBA1C" No results found for: "VITAMINB12" No results found for: "TSH" No results found for: "ESRSEDRATE", "POCTSEDRATE"  No past medical history on file.  No past surgical history on file.   Medications:  Outpatient Encounter Medications as of 02/19/2022  Medication Sig   benzonatate (TESSALON) 100 MG capsule Take 1 capsule (100 mg total) by mouth every 8 (eight) hours.   fluticasone (FLONASE) 50 MCG/ACT nasal spray Place 2 sprays into both nostrils daily.   guaiFENesin (MUCINEX) 600 MG 12 hr tablet Take 1 tablet (600 mg total) by mouth 2 (two) times daily as needed.   ibuprofen (ADVIL) 800 MG tablet Take 1 tablet (800 mg total) by mouth 3 (three) times daily.   ondansetron (ZOFRAN) 4 MG tablet Take 1 tablet (4 mg total) by mouth every 6 (six) hours.   [DISCONTINUED] cetirizine (ZYRTEC ALLERGY) 10 MG tablet Take 1 tablet (10 mg total) by mouth daily.   [DISCONTINUED] famotidine (PEPCID) 20 MG tablet Take bid for 3 days then as needed thereafter for heartburn   [DISCONTINUED] omeprazole (PRILOSEC) 20 MG capsule Take 1 capsule (20 mg  total) by mouth daily.   No facility-administered encounter medications on file as of 02/19/2022.    Allergies: No Known Allergies  Family History: Family History  Problem Relation Age of Onset   Healthy Mother    Healthy Father     Social History: Social History   Tobacco Use   Smoking status: Former    Types: Cigars   Smokeless tobacco: Never  Substance Use Topics   Alcohol use: Yes    Comment: occ   Drug use: Yes    Types: Marijuana   Social History   Social History Narrative   Not on file    Vital Signs:  There were no vitals taken for this visit.  Neurological Exam: MENTAL STATUS including orientation to time, place, person, recent and remote memory, attention span and concentration, language, and fund of knowledge is normal.  Speech is not dysarthric.  CRANIAL NERVES: II:  No visual field defects.   III-IV-VI: Pupils equal round and reactive to light.  Normal conjugate, extra-ocular eye movements in all directions of gaze.  No nystagmus.  No ptosis***.   V:  Normal facial sensation.    VII:  Normal facial symmetry and movements.   VIII:  Normal hearing and vestibular function.   IX-X:  Normal palatal movement.   XI:  Normal shoulder shrug and head rotation.   XII:  Normal tongue strength and range of motion, no deviation or fasciculation.  MOTOR:  No atrophy, fasciculations or abnormal movements.  No pronator drift.   Upper Extremity:  Right  Left  Deltoid  5/5  5/5   Biceps  5/5   5/5   Triceps  5/5   5/5   Wrist extensors  5/5   5/5   Wrist flexors  5/5   5/5   Finger extensors  5/5   5/5   Finger flexors  5/5   5/5   Dorsal interossei  5/5   5/5   Abductor pollicis  5/5   5/5   Tone (Ashworth scale)  0  0   Lower Extremity:  Right  Left  Hip flexors  5/5   5/5   Knee flexors  5/5   5/5   Knee extensors  5/5   5/5   Dorsiflexors  5/5   5/5   Plantarflexors  5/5   5/5   Toe extensors  5/5   5/5   Toe flexors  5/5   5/5   Tone (Ashworth  scale)  0  0   MSRs:  Right        Left                  brachioradialis 2+  2+  biceps 2+  2+  triceps 2+  2+  patellar 2+  2+  ankle jerk 2+  2+  Hoffman no  no  plantar response down  down   SENSORY:  Normal and symmetric perception of light touch, pinprick, vibration, and proprioception.  Romberg's sign absent.   COORDINATION/GAIT: Normal finger-to- nose-finger and heel-to-shin.  Intact rapid alternating movements bilaterally.  Able to rise from a chair without using arms.  Gait narrow based and stable. Tandem and stressed gait intact.    ***   Thank you for allowing me to participate in patient's care.  If I can answer any additional questions, I would be pleased to do so.    Sincerely,    Eduin Friedel K. Allena Katz, DO

## 2022-02-19 ENCOUNTER — Ambulatory Visit: Payer: Self-pay | Admitting: Neurology

## 2022-02-24 ENCOUNTER — Ambulatory Visit: Payer: Medicaid Other | Admitting: Neurology

## 2022-04-30 ENCOUNTER — Encounter (HOSPITAL_COMMUNITY): Payer: Self-pay

## 2022-04-30 ENCOUNTER — Ambulatory Visit (HOSPITAL_COMMUNITY)
Admission: EM | Admit: 2022-04-30 | Discharge: 2022-04-30 | Disposition: A | Discharge status: Home / Self Care | Payer: Medicaid Other | Attending: Family Medicine | Admitting: Family Medicine

## 2022-04-30 DIAGNOSIS — N760 Acute vaginitis: Secondary | ICD-10-CM

## 2022-04-30 DIAGNOSIS — Z113 Encounter for screening for infections with a predominantly sexual mode of transmission: Secondary | ICD-10-CM

## 2022-04-30 LAB — HIV ANTIBODY (ROUTINE TESTING W REFLEX): HIV Screen 4th Generation wRfx: NONREACTIVE

## 2022-04-30 NOTE — Discharge Instructions (Signed)
Staff will notify you of any positives on the swab or any abnormal blood tests

## 2022-04-30 NOTE — ED Provider Notes (Signed)
[facility]    CSN: [id number] Arrival date & time: 04/30/22  0802      History   Chief Complaint Chief Complaint  Patient presents with   Vaginal Discharge    HPI Jasmine Walters is a 21 y.o. female.    Vaginal Discharge  Here for vaginal dc, going on since last period ended, about 10-14 days. LMP was about October 16.  No abdominal pain and no fever and no vomiting.  No dysuria.  She wishes to be checked for STDs, including HIV and syphilis  History reviewed. No pertinent past medical history.  There are no problems to display for this patient.   History reviewed. No pertinent surgical history.  OB History   No obstetric history on file.      Home Medications    Prior to Admission medications   Medication Sig Start Date End Date Taking? Authorizing Provider  cetirizine (ZYRTEC ALLERGY) 10 MG tablet Take 1 tablet (10 mg total) by mouth daily. 10/22/19 03/02/20  Wallis Bamberg, PA-C  famotidine (PEPCID) 20 MG tablet Take bid for 3 days then as needed thereafter for heartburn 04/18/18 04/25/19  Ree Shay, MD  omeprazole (PRILOSEC) 20 MG capsule Take 1 capsule (20 mg total) by mouth daily. 09/07/17 04/25/19  Antony Madura, PA-C    Family History Family History  Problem Relation Age of Onset   Healthy Mother    Healthy Father     Social History Social History   Tobacco Use   Smoking status: Former    Types: Cigars   Smokeless tobacco: Never  Substance Use Topics   Alcohol use: Yes    Comment: occ   Drug use: Yes    Types: Marijuana     Allergies   Patient has no known allergies.   Review of Systems Review of Systems  Genitourinary:  Positive for vaginal discharge.     Physical Exam Triage Vital Signs ED Triage Vitals [04/30/22 0822]  Enc Vitals Group     BP 105/62     Pulse Rate 62     Resp 16     Temp 98.6 F (37 C)     Temp Source Oral     SpO2 99 %     Weight      Height      Head Circumference      Peak Flow      Pain  Score      Pain Loc      Pain Edu?      Excl. in GC?    No data found.  Updated Vital Signs BP 105/62 (BP Location: Left Arm)   Pulse 62   Temp 98.6 F (37 C) (Oral)   Resp 16   LMP 04/21/2022   SpO2 99%   Visual Acuity Right Eye Distance:   Left Eye Distance:   Bilateral Distance:    Right Eye Near:   Left Eye Near:    Bilateral Near:     Physical Exam Vitals reviewed.  Constitutional:      General: She is not in acute distress.    Appearance: She is not ill-appearing, toxic-appearing or diaphoretic.  HENT:     Mouth/Throat:     Mouth: Mucous membranes are moist.  Eyes:     Extraocular Movements: Extraocular movements intact.     Pupils: Pupils are equal, round, and reactive to light.  Cardiovascular:     Rate and Rhythm: Normal rate and regular rhythm.  Heart sounds: No murmur heard. Pulmonary:     Effort: Pulmonary effort is normal.     Breath sounds: Normal breath sounds. No stridor.  Musculoskeletal:     Cervical back: Neck supple.  Lymphadenopathy:     Cervical: No cervical adenopathy.  Skin:    Coloration: Skin is not jaundiced or pale.  Neurological:     General: No focal deficit present.     Mental Status: She is alert and oriented to person, place, and time.  Psychiatric:        Behavior: Behavior normal.      UC Treatments / Results  Labs (all labs ordered are listed, but only abnormal results are displayed) Labs Reviewed  RPR  HIV ANTIBODY (ROUTINE TESTING W REFLEX)  CERVICOVAGINAL ANCILLARY ONLY    EKG   Radiology No results found.  Procedures Procedures (including critical care time)  Medications Ordered in UC Medications - No data to display  Initial Impression / Assessment and Plan / UC Course  I have reviewed the triage vital signs and the nursing notes.  Pertinent labs & imaging results that were available during my care of the patient were reviewed by me and considered in my medical decision making (see chart for  details).       Staff will notify her of any positives on the swab or blood work.  Final Clinical Impressions(s) / UC Diagnoses   Final diagnoses:  Acute vaginitis  Screen for STD (sexually transmitted disease)     Discharge Instructions      Staff will notify you of any positives on the swab or any abnormal blood tests     ED Prescriptions   None    PDMP not reviewed this encounter.   Zenia Resides, MD 04/30/22 [phone]

## 2022-04-30 NOTE — ED Triage Notes (Signed)
Pt presents to the office for vaginal discharge for several days. Pt would like STD testing.

## 2022-05-01 LAB — RPR: RPR Ser Ql: NONREACTIVE

## 2022-05-03 ENCOUNTER — Telehealth (HOSPITAL_COMMUNITY): Payer: Self-pay | Admitting: Emergency Medicine

## 2022-05-03 LAB — CERVICOVAGINAL ANCILLARY ONLY
Bacterial Vaginitis (gardnerella): POSITIVE — AB
Candida Glabrata: NEGATIVE
Candida Vaginitis: NEGATIVE
Chlamydia: NEGATIVE
Comment: NEGATIVE
Comment: NEGATIVE
Comment: NEGATIVE
Comment: NEGATIVE
Comment: NEGATIVE
Comment: NORMAL
Neisseria Gonorrhea: NEGATIVE
Trichomonas: POSITIVE — AB

## 2022-05-03 MED ORDER — METRONIDAZOLE 500 MG PO TABS: 500.0000 mg | ORAL_TABLET | Freq: Two times a day (BID) | ORAL | 0 refills | Status: DC

## 2022-06-28 ENCOUNTER — Ambulatory Visit (HOSPITAL_COMMUNITY)
Admission: EM | Admit: 2022-06-28 | Discharge: 2022-06-28 | Disposition: A | Discharge status: Home / Self Care | Payer: Medicaid Other | Attending: Emergency Medicine | Admitting: Emergency Medicine

## 2022-06-28 ENCOUNTER — Other Ambulatory Visit: Payer: Self-pay

## 2022-06-28 ENCOUNTER — Encounter (HOSPITAL_COMMUNITY): Payer: Self-pay | Admitting: *Deleted

## 2022-06-28 DIAGNOSIS — N898 Other specified noninflammatory disorders of vagina: Secondary | ICD-10-CM

## 2022-06-28 DIAGNOSIS — N926 Irregular menstruation, unspecified: Secondary | ICD-10-CM | POA: Diagnosis present

## 2022-06-28 LAB — HIV ANTIBODY (ROUTINE TESTING W REFLEX): HIV Screen 4th Generation wRfx: NONREACTIVE

## 2022-06-28 LAB — HCG, QUANTITATIVE, PREGNANCY: hCG, Beta Chain, Quant, S: 1 m[IU]/mL (ref <5)

## 2022-06-28 LAB — RPR: RPR Ser Ql: NONREACTIVE

## 2022-06-28 LAB — POC URINE PREG, ED: Preg Test, Ur: NEGATIVE

## 2022-06-28 NOTE — Discharge Instructions (Addendum)
We will call you if any of your test results are positive, you may view these test results on MyChart. 

## 2022-06-28 NOTE — ED Provider Notes (Signed)
[facility]    CSN: [id number] Arrival date & time: 06/28/22  6387      History   Chief Complaint Chief Complaint  Patient presents with   Vaginal Bleeding   Vaginal Discharge    HPI Jasmine Walters is a 22 y.o. female.  Patient presents complaining of irregular vaginal bleeding and vaginal discharge.  She states that her last menstrual period started on 06/15/2022, she states that her cycle normally would last approximately 5 to 7 days, but her last cycle continued into 2 weeks.  She reports that the bleeding was heavy in some instances but began to dissipate with pink to dark brown blood.  She states that the bleeding has now subsided.  She reports at the end of her cycle she had an episode of abnormal smelling, thin discharge.  She states that the odor has now subsided.  She denies any dysuria, abdominal pain, vaginal itching, pelvic pain, nausea, rash, lesion, vomiting, fever, chills, or any systemic symptoms.    She reports that she has had irregular menstrual cycle in the past.  She reports that she is uncertain if the irregular cycle is due to increased stress and/or changes in her diet recently.  She denies any current hormonal contraceptive or any use of emergency contraceptive. Patient is requesting blood work to rule out pregnancy and STD testing.   She was seen on 04/30/2022 at this clinic, diagnosed with trichomoniasis and bacterial vaginosis.  She reports that she finished the Flagyl antibiotic.  She states that she has not had any sexual encounters since her last visit in November.    Vaginal Bleeding Vaginal Discharge   History reviewed. No pertinent past medical history.  There are no problems to display for this patient.   History reviewed. No pertinent surgical history.  OB History   No obstetric history on file.      Home Medications    Prior to Admission medications   Medication Sig Start Date End Date Taking? Authorizing Provider  cetirizine  (ZYRTEC ALLERGY) 10 MG tablet Take 1 tablet (10 mg total) by mouth daily. 10/22/19 03/02/20  Wallis Bamberg, PA-C  famotidine (PEPCID) 20 MG tablet Take bid for 3 days then as needed thereafter for heartburn 04/18/18 04/25/19  Ree Shay, MD  omeprazole (PRILOSEC) 20 MG capsule Take 1 capsule (20 mg total) by mouth daily. 09/07/17 04/25/19  Antony Madura, PA-C    Family History Family History  Problem Relation Age of Onset   Healthy Mother    Healthy Father     Social History Social History   Tobacco Use   Smoking status: Former    Types: Cigars   Smokeless tobacco: Never  Substance Use Topics   Alcohol use: Yes    Comment: occ   Drug use: Yes    Types: Marijuana     Allergies   Patient has no known allergies.   Review of Systems Review of Systems Per HPI  Physical Exam Triage Vital Signs ED Triage Vitals  Enc Vitals Group     BP 06/28/22 1033 111/65     Pulse Rate 06/28/22 1033 (!) 51     Resp 06/28/22 1033 18     Temp 06/28/22 1033 97.7 F (36.5 C)     Temp src --      SpO2 06/28/22 1033 100 %     Weight --      Height --      Head Circumference --      Peak  Flow --      Pain Score 06/28/22 1032 0     Pain Loc --      Pain Edu? --      Excl. in [place]? --    No data found.  Updated Vital Signs BP 111/65   Pulse (!) 51   Temp 97.7 F (36.5 C)   Resp 18   LMP 06/15/2022   SpO2 100%     Physical Exam Vitals and nursing note reviewed.  Constitutional:      Appearance: Normal appearance.  Genitourinary:    Comments: Deferred exam Neurological:     Mental Status: She is alert.      UC Treatments / Results  Labs (all labs ordered are listed, but only abnormal results are displayed) Labs Reviewed  RPR  HIV ANTIBODY (ROUTINE TESTING W REFLEX)  HCG, QUANTITATIVE, PREGNANCY  POC URINE PREG, ED  CERVICOVAGINAL ANCILLARY ONLY    EKG   Radiology No results found.  Procedures Procedures (including critical care time)  Medications Ordered in  UC Medications - No data to display  Initial Impression / Assessment and Plan / UC Course  I have reviewed the triage vital signs and the nursing notes.  Pertinent labs & imaging results that were available during my care of the patient were reviewed by me and considered in my medical decision making (see chart for details).     Patient was evaluated for irregular menstrual cycle and vaginal discharge. POC Pregnancy was Negative.  RPR, HIV, HcG quantitative, and vaginal swab is pending. Based on symptomology and patient statement, waiting for test results appears to be best plan of care. Patient made aware of timeline for symptom resolution and when follow-up would be necessary.  Patient made aware of results reporting protocol and MyChart.  Patient verbalized understanding of instructions.    Charting was provided using a a verbal dictation system, charting was proofread for errors, errors may occur which could change the meaning of the information charted.   Final Clinical Impressions(s) / UC Diagnoses   Final diagnoses:  Irregular menstrual cycle  Vaginal discharge     Discharge Instructions      We will call you if any of your test results are positive, you may view these test results on MyChart.          ED Prescriptions   None    PDMP not reviewed this encounter.   Flossie Dibble, NP 06/28/22 [phone]

## 2022-06-28 NOTE — ED Triage Notes (Signed)
Pt reports she wants her vaginal area checked out. Pt had a period for 2 weeks which has stopped and she has a vag discharge. Pt reports she wants to make sure everything is alright.

## 2022-06-29 LAB — CERVICOVAGINAL ANCILLARY ONLY
Bacterial Vaginitis (gardnerella): POSITIVE — AB
Candida Glabrata: NEGATIVE
Candida Vaginitis: NEGATIVE
Chlamydia: NEGATIVE
Comment: NEGATIVE
Comment: NEGATIVE
Comment: NEGATIVE
Comment: NEGATIVE
Comment: NEGATIVE
Comment: NORMAL
Neisseria Gonorrhea: NEGATIVE
Trichomonas: NEGATIVE

## 2022-06-30 ENCOUNTER — Telehealth (HOSPITAL_COMMUNITY): Payer: Self-pay | Admitting: Emergency Medicine

## 2022-06-30 MED ORDER — METRONIDAZOLE 500 MG PO TABS: 500.0000 mg | ORAL_TABLET | Freq: Two times a day (BID) | ORAL | 0 refills | Status: DC

## 2022-08-06 ENCOUNTER — Ambulatory Visit (HOSPITAL_COMMUNITY)
Admission: EM | Admit: 2022-08-06 | Discharge: 2022-08-06 | Disposition: A | Discharge status: Home / Self Care | Payer: Medicaid Other | Attending: Emergency Medicine | Admitting: Emergency Medicine

## 2022-08-06 ENCOUNTER — Encounter (HOSPITAL_COMMUNITY): Payer: Self-pay | Admitting: *Deleted

## 2022-08-06 DIAGNOSIS — Z711 Person with feared health complaint in whom no diagnosis is made: Secondary | ICD-10-CM | POA: Insufficient documentation

## 2022-08-06 DIAGNOSIS — N898 Other specified noninflammatory disorders of vagina: Secondary | ICD-10-CM | POA: Diagnosis present

## 2022-08-06 LAB — HIV ANTIBODY (ROUTINE TESTING W REFLEX): HIV Screen 4th Generation wRfx: NONREACTIVE

## 2022-08-06 LAB — POC URINE PREG, ED: Preg Test, Ur: NEGATIVE

## 2022-08-06 NOTE — ED Triage Notes (Signed)
Pt states she is having vaginal discharge X 4 days. She is worried since last time she had BV. She does state she had a mew female partner. She would like all STI testing

## 2022-08-06 NOTE — ED Provider Notes (Signed)
[facility]    CSN: [id number] Arrival date & time: 08/06/22  [id number]      History   Chief Complaint Chief Complaint  Patient presents with   SEXUALLY TRANSMITTED DISEASE   Vaginal Discharge    HPI Jasmine Walters is a 22 y.o. female.   Reports symptoms started Tuesday, she noticed an odor and abnormal discharge today. Recently had a new sexual partner, reports she is only sexually active with females.Marland Kitchen LMP was January 18th or the week after.  Does have a history of BV.  Request screening for all sexually transmitted diseases.  Denies fevers, pain with intercourse, or dysuria.   The history is provided by the patient.  Vaginal Discharge Associated symptoms: no abdominal pain, no dyspareunia, no dysuria and no fever     History reviewed. No pertinent past medical history.  There are no problems to display for this patient.   History reviewed. No pertinent surgical history.  OB History   No obstetric history on file.      Home Medications    Prior to Admission medications   Medication Sig Start Date End Date Taking? Authorizing Provider  cetirizine (ZYRTEC ALLERGY) 10 MG tablet Take 1 tablet (10 mg total) by mouth daily. 10/22/19 03/02/20  Jaynee Eagles, PA-C  famotidine (PEPCID) 20 MG tablet Take bid for 3 days then as needed thereafter for heartburn 04/18/18 04/25/19  Harlene Salts, MD  omeprazole (PRILOSEC) 20 MG capsule Take 1 capsule (20 mg total) by mouth daily. 09/07/17 04/25/19  Antonietta Breach, PA-C    Family History Family History  Problem Relation Age of Onset   Healthy Mother    Healthy Father     Social History Social History   Tobacco Use   Smoking status: Former    Types: Cigars   Smokeless tobacco: Never  Scientific laboratory technician Use: Never used  Substance Use Topics   Alcohol use: Yes    Comment: occ   Drug use: Yes    Types: Marijuana     Allergies   Patient has no known allergies.   Review of Systems Review of Systems   Constitutional:  Negative for fever.  Respiratory:  Negative for cough and shortness of breath.   Cardiovascular:  Negative for chest pain.  Gastrointestinal:  Negative for abdominal pain.  Genitourinary:  Positive for vaginal discharge. Negative for dyspareunia, dysuria, genital sores, menstrual problem and pelvic pain.     Physical Exam Triage Vital Signs ED Triage Vitals  Enc Vitals Group     BP 08/06/22 0843 104/67     Pulse Rate 08/06/22 0843 81     Resp 08/06/22 0843 18     Temp 08/06/22 0843 98.3 F (36.8 C)     Temp Source 08/06/22 0843 Oral     SpO2 08/06/22 0843 98 %     Weight --      Height --      Head Circumference --      Peak Flow --      Pain Score 08/06/22 0841 0     Pain Loc --      Pain Edu? --      Excl. in [place]? --    No data found.  Updated Vital Signs BP 104/67 (BP Location: Left Arm)   Pulse 81   Temp 98.3 F (36.8 C) (Oral)   Resp 18   LMP 07/08/2022 (Approximate)   SpO2 98%   Visual Acuity Right Eye Distance:   Left  Eye Distance:   Bilateral Distance:    Right Eye Near:   Left Eye Near:    Bilateral Near:     Physical Exam Vitals and nursing note reviewed.  Constitutional:      Appearance: Normal appearance.     Comments: Pleasant 22 year old female who appears stated age.  HENT:     Head: Normocephalic and atraumatic.     Right Ear: External ear normal.     Left Ear: External ear normal.     Nose: Nose normal.     Mouth/Throat:     Mouth: Mucous membranes are moist.  Cardiovascular:     Rate and Rhythm: Normal rate and regular rhythm.     Pulses: Normal pulses.     Heart sounds: Normal heart sounds, S1 normal and S2 normal.  Pulmonary:     Effort: Pulmonary effort is normal. No respiratory distress.     Breath sounds: Normal breath sounds.     Comments: Lungs vesicular posteriorly. Neurological:     Mental Status: She is alert.  Psychiatric:        Behavior: Behavior is cooperative.      UC Treatments / Results   Labs (all labs ordered are listed, but only abnormal results are displayed) Labs Reviewed  RPR  HIV ANTIBODY (ROUTINE TESTING W REFLEX)  POC URINE PREG, ED  CERVICOVAGINAL ANCILLARY ONLY    EKG   Radiology No results found.  Procedures Procedures (including critical care time)  Medications Ordered in UC Medications - No data to display  Initial Impression / Assessment and Plan / UC Course  I have reviewed the triage vital signs and the nursing notes.  Pertinent labs & imaging results that were available during my care of the patient were reviewed by me and considered in my medical decision making (see chart for details).  Presents to clinic with vaginal discharge and odor.  Sexually transmitted disease screening is pending, if patient has any positive results we will treat accordingly.  Urine pregnancy is negative.  Treatment deferred at this point as we await on testing, patient verbalized understanding with treatment plan.  Return precautions discussed.    Final Clinical Impressions(s) / UC Diagnoses   Final diagnoses:  Concern about STD in female without diagnosis  Vaginal discharge     Discharge Instructions      Today we have screened you for sexually transmitted infections.  If any of these test result as positive we will call you to start treatment.  While you are waiting for results and if you test positive, please abstain from intercourse over the next 7 days.  If any of your results come back positive, please inform your partners so they can seek testing and treatment as well.  Please ensure you are urinating after intercourse, wearing cotton underwear, cleaning toys before and after use and using protection.  Please follow-up with your primary care as needed.  Please seek immediate medical care if you develop pain with intercourse, fever that does not resolve with medication, or worsening of symptoms.     ED Prescriptions   None    I have reviewed the  PDMP during this encounter.   Louretta Shorten [place] [address] 08/06/22 [phone]

## 2022-08-06 NOTE — Discharge Instructions (Addendum)
Today we have screened you for sexually transmitted infections.  If any of these test result as positive we will call you to start treatment.  While you are waiting for results and if you test positive, please abstain from intercourse over the next 7 days.  If any of your results come back positive, please inform your partners so they can seek testing and treatment as well.  Please ensure you are urinating after intercourse, wearing cotton underwear, cleaning toys before and after use and using protection.  Please follow-up with your primary care as needed.  Please seek immediate medical care if you develop pain with intercourse, fever that does not resolve with medication, or worsening of symptoms.

## 2022-08-07 LAB — RPR: RPR Ser Ql: NONREACTIVE

## 2022-08-09 LAB — CERVICOVAGINAL ANCILLARY ONLY
Bacterial Vaginitis (gardnerella): NEGATIVE
Candida Glabrata: NEGATIVE
Candida Vaginitis: NEGATIVE
Chlamydia: NEGATIVE
Comment: NEGATIVE
Comment: NEGATIVE
Comment: NEGATIVE
Comment: NEGATIVE
Comment: NEGATIVE
Comment: NORMAL
Neisseria Gonorrhea: NEGATIVE
Trichomonas: NEGATIVE

## 2022-10-18 ENCOUNTER — Encounter (HOSPITAL_COMMUNITY): Payer: Self-pay | Admitting: *Deleted

## 2022-10-18 ENCOUNTER — Other Ambulatory Visit: Payer: Self-pay

## 2022-10-18 ENCOUNTER — Ambulatory Visit (HOSPITAL_COMMUNITY)
Admission: EM | Admit: 2022-10-18 | Discharge: 2022-10-18 | Disposition: A | Discharge status: Home / Self Care | Payer: Medicaid Other | Attending: Emergency Medicine | Admitting: Emergency Medicine

## 2022-10-18 ENCOUNTER — Other Ambulatory Visit (HOSPITAL_COMMUNITY): Payer: Self-pay | Admitting: Emergency Medicine

## 2022-10-18 DIAGNOSIS — N6012 Diffuse cystic mastopathy of left breast: Secondary | ICD-10-CM

## 2022-10-18 DIAGNOSIS — N6011 Diffuse cystic mastopathy of right breast: Secondary | ICD-10-CM | POA: Diagnosis not present

## 2022-10-18 DIAGNOSIS — N63 Unspecified lump in unspecified breast: Secondary | ICD-10-CM | POA: Insufficient documentation

## 2022-10-18 DIAGNOSIS — N6310 Unspecified lump in the right breast, unspecified quadrant: Secondary | ICD-10-CM

## 2022-10-18 DIAGNOSIS — Z113 Encounter for screening for infections with a predominantly sexual mode of transmission: Secondary | ICD-10-CM | POA: Insufficient documentation

## 2022-10-18 NOTE — ED Provider Notes (Signed)
[facility]    CSN: [id number] Arrival date & time: 10/18/22  9147      History   Chief Complaint Chief Complaint  Patient presents with   Breast Mass   Exposure to STD   Vaginal Discharge    HPI Jasmine Walters is a 22 y.o. female.  Here with vaginal itching, requesting STD testing. No known exposure. Monogamous relationship with girlfriend. Denies rash, abd pain, urinary symptoms.  2 day history of lump in the left breast. Denies history of this. Area is somewhat tender. She feels it move around. No drainage from the area or nipple. No concerns of the right breast  LMP 4/15  History reviewed. No pertinent past medical history.  There are no problems to display for this patient.   History reviewed. No pertinent surgical history.  OB History   No obstetric history on file.      Home Medications    Prior to Admission medications   Medication Sig Start Date End Date Taking? Authorizing Provider  cetirizine (ZYRTEC ALLERGY) 10 MG tablet Take 1 tablet (10 mg total) by mouth daily. 10/22/19 03/02/20  Wallis Bamberg, PA-C  famotidine (PEPCID) 20 MG tablet Take bid for 3 days then as needed thereafter for heartburn 04/18/18 04/25/19  Ree Shay, MD  omeprazole (PRILOSEC) 20 MG capsule Take 1 capsule (20 mg total) by mouth daily. 09/07/17 04/25/19  Antony Madura, PA-C    Family History Family History  Problem Relation Age of Onset   Healthy Mother    Healthy Father     Social History Social History   Tobacco Use   Smoking status: Former    Types: Cigars   Smokeless tobacco: Never  Building services engineer Use: Never used  Substance Use Topics   Alcohol use: Yes    Comment: occ   Drug use: Yes    Types: Marijuana     Allergies   Patient has no known allergies.   Review of Systems Review of Systems As per HPI  Physical Exam Triage Vital Signs ED Triage Vitals [10/18/22 1010]  Enc Vitals Group     BP      Pulse      Resp      Temp      Temp src       SpO2      Weight      Height      Head Circumference      Peak Flow      Pain Score 0     Pain Loc      Pain Edu?      Excl. in [place]?    No data found.  Updated Vital Signs BP 97/66   Pulse (!) 47   Temp 98.3 F (36.8 C)   Resp 18   LMP 10/04/2022   SpO2 99%     Physical Exam Vitals and nursing note reviewed. Exam conducted with a chaperone present Charna Elizabeth).  Constitutional:      General: She is not in acute distress. HENT:     Mouth/Throat:     Pharynx: Oropharynx is clear.  Cardiovascular:     Rate and Rhythm: Normal rate and regular rhythm.  Pulmonary:     Effort: Pulmonary effort is normal.  Chest:  Breasts:    Right: No inverted nipple, nipple discharge or skin change.     Left: No inverted nipple, nipple discharge or skin change.       Comments: There are  several small, mobile, minimally tender nodules of bilateral breasts. There are no skin or nipple changes.  Musculoskeletal:     Cervical back: Normal range of motion.  Skin:    General: Skin is warm and dry.  Neurological:     Mental Status: She is alert and oriented to person, place, and time.     UC Treatments / Results  Labs (all labs ordered are listed, but only abnormal results are displayed) Labs Reviewed  CERVICOVAGINAL ANCILLARY ONLY    EKG   Radiology No results found.  Procedures Procedures (including critical care time)  Medications Ordered in UC Medications - No data to display  Initial Impression / Assessment and Plan / UC Course  I have reviewed the triage vital signs and the nursing notes.  Pertinent labs & imaging results that were available during my care of the patient were reviewed by me and considered in my medical decision making (see chart for details).  Cytology swab pending.  Breast U/S order placed. Consider fibrocystic breast changes given the distribution and mobility, although still recommend U/S imaging. Recommend to set up with PCP. Return  precautions discussed.   Final Clinical Impressions(s) / UC Diagnoses   Final diagnoses:  Screen for STD (sexually transmitted disease)  Fibrocystic breast changes of both breasts  Breast mass in female     Discharge Instructions      We will call you if anything on your swab returns positive. Please abstain from sexual intercourse until your results return.  The breast center will call you to schedule an appointment for ultrasound.  You can scan the QR code on the last page to get yourself set up with a primary care provider.     ED Prescriptions   None    PDMP not reviewed this encounter.   Icelyn Navarrete, Lurena Joiner, [place] 10/18/22 1038

## 2022-10-18 NOTE — ED Triage Notes (Signed)
Pt reports she first noticed lunp on Lateral Lt breast 2 days ago. Pt also has vag itching. Pt requestinf STD work up.

## 2022-10-18 NOTE — Discharge Instructions (Signed)
We will call you if anything on your swab returns positive. Please abstain from sexual intercourse until your results return.  The breast center will call you to schedule an appointment for ultrasound.  You can scan the QR code on the last page to get yourself set up with a primary care provider.

## 2022-10-19 LAB — CERVICOVAGINAL ANCILLARY ONLY
Bacterial Vaginitis (gardnerella): NEGATIVE
Candida Glabrata: NEGATIVE
Candida Vaginitis: NEGATIVE
Chlamydia: NEGATIVE
Comment: NEGATIVE
Comment: NEGATIVE
Comment: NEGATIVE
Comment: NEGATIVE
Comment: NEGATIVE
Comment: NORMAL
Neisseria Gonorrhea: NEGATIVE
Trichomonas: NEGATIVE

## 2022-10-27 ENCOUNTER — Ambulatory Visit
Admission: RE | Admit: 2022-10-27 | Discharge: 2022-10-27 | Disposition: A | Discharge status: Home / Self Care | Payer: Medicaid Other | Source: Ambulatory Visit | Attending: Emergency Medicine | Admitting: Emergency Medicine

## 2022-10-27 DIAGNOSIS — N6011 Diffuse cystic mastopathy of right breast: Secondary | ICD-10-CM

## 2022-10-27 DIAGNOSIS — N6310 Unspecified lump in the right breast, unspecified quadrant: Secondary | ICD-10-CM

## 2022-10-27 DIAGNOSIS — N63 Unspecified lump in unspecified breast: Secondary | ICD-10-CM

## 2022-11-02 ENCOUNTER — Other Ambulatory Visit: Payer: Self-pay | Admitting: Emergency Medicine

## 2022-11-02 DIAGNOSIS — N6310 Unspecified lump in the right breast, unspecified quadrant: Secondary | ICD-10-CM

## 2022-11-02 DIAGNOSIS — N6320 Unspecified lump in the left breast, unspecified quadrant: Secondary | ICD-10-CM

## 2022-12-25 ENCOUNTER — Ambulatory Visit
Admission: EM | Admit: 2022-12-25 | Discharge: 2022-12-25 | Disposition: A | Discharge status: Home / Self Care | Payer: Medicaid Other | Attending: Internal Medicine | Admitting: Internal Medicine

## 2022-12-25 DIAGNOSIS — G629 Polyneuropathy, unspecified: Secondary | ICD-10-CM | POA: Diagnosis not present

## 2022-12-25 MED ORDER — IBUPROFEN 600 MG PO TABS: 600.0000 mg | ORAL_TABLET | Freq: Four times a day (QID) | ORAL | 0 refills | Status: AC | PRN

## 2022-12-25 NOTE — ED Triage Notes (Signed)
Pt presents with c/o tingling to the tt calf and leg tingling X 2 days.   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 States she wears heels often. States she has not taken anything for pain.

## 2022-12-25 NOTE — ED Provider Notes (Signed)
  [facility]  Note:  This document was prepared using Conservation officer, historic buildings and may include unintentional dictation errors.  MRN: [id number] DOB: 2000-12-17  Subjective:   Jasmine Walters is a 22 y.o. female presenting for 2-day history of acute onset persistent bilateral intermittent tingling of the posterior calves.  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  No history of clotting disorders.  No calf pain, calf redness, calf swelling.  No injury to the area.  No current facility-administered medications for this encounter. No current outpatient medications on file.   No Known Allergies  History reviewed. No pertinent past medical history.   History reviewed. No pertinent surgical history.  Family History  Problem Relation Age of Onset   Healthy Mother    Healthy Father     Social History   Tobacco Use   Smoking status: Former    Types: Cigars   Smokeless tobacco: Never  Vaping Use   Vaping Use: Never used  Substance Use Topics   Alcohol use: Yes    Comment: occ   Drug use: Yes    Types: Marijuana    ROS   Objective:   Vitals: BP 96/61 (BP Location: Right Arm)   Pulse 60   Temp 97.8 F (36.6 C) (Oral)   Resp 18   LMP 11/23/2022 (Approximate)   SpO2 98%   Physical Exam Constitutional:      General: She is not in acute distress.    Appearance: Normal appearance. She is well-developed. She is not ill-appearing, toxic-appearing or diaphoretic.  HENT:     Head: Normocephalic and atraumatic.     Nose: Nose normal.     Mouth/Throat:     Mouth: Mucous membranes are moist.  Eyes:     General: No scleral icterus.       Right eye: No discharge.        Left eye: No discharge.     Extraocular Movements: Extraocular movements intact.  Cardiovascular:     Rate and Rhythm: Normal rate.  Pulmonary:     Effort: Pulmonary effort is  normal.  Musculoskeletal:     Right lower leg: No swelling, deformity, lacerations, tenderness or bony tenderness. No edema.     Left lower leg: No swelling, deformity, lacerations, tenderness or bony tenderness. No edema.  Skin:    General: Skin is warm and dry.     Findings: No rash.  Neurological:     General: No focal deficit present.     Mental Status: She is alert and oriented to person, place, and time.  Psychiatric:        Mood and Affect: Mood normal.        Behavior: Behavior normal.     Assessment and Plan :   PDMP not reviewed this encounter.  1. Neuropathy    No symptoms elicited on exam.  Suspect of neuropathy likely related to the nature of her work, the use of high heels.  Recommended ibuprofen, resting.  Low suspicion for a DVT, cellulitis, acute medical emergency.  Offered a note for work but she declined.  Counseled patient on potential for adverse effects with medications prescribed/recommended today, ER and return-to-clinic precautions discussed, patient verbalized understanding.    Wallis Bamberg, [place] 12/25/22 1257

## 2023-01-19 ENCOUNTER — Ambulatory Visit (HOSPITAL_COMMUNITY)
Admission: EM | Admit: 2023-01-19 | Discharge: 2023-01-19 | Disposition: A | Discharge status: Home / Self Care | Payer: Medicaid Other | Attending: Family Medicine | Admitting: Family Medicine

## 2023-01-19 ENCOUNTER — Encounter (HOSPITAL_COMMUNITY): Payer: Self-pay

## 2023-01-19 DIAGNOSIS — Z113 Encounter for screening for infections with a predominantly sexual mode of transmission: Secondary | ICD-10-CM | POA: Insufficient documentation

## 2023-01-19 DIAGNOSIS — N898 Other specified noninflammatory disorders of vagina: Secondary | ICD-10-CM | POA: Diagnosis present

## 2023-01-19 LAB — HIV ANTIBODY (ROUTINE TESTING W REFLEX): HIV Screen 4th Generation wRfx: NONREACTIVE

## 2023-01-19 NOTE — Discharge Instructions (Signed)
We have sent testing for sexually transmitted infections. We will notify you of any positive results once they are received. If required, we will prescribe any medications you might need.  Please refrain from all sexual activity for at least the next seven days.

## 2023-01-19 NOTE — ED Provider Notes (Signed)
[facility]   [id number] 01/19/23 Arrival Time: 1009  ASSESSMENT & PLAN:  1. Vaginal discharge   2. Screening for STDs (sexually transmitted diseases)      Discharge Instructions      We have sent testing for sexually transmitted infections. We will notify you of any positive results once they are received. If required, we will prescribe any medications you might need.  Please refrain from all sexual activity for at least the next seven days.     Normal exam.  Labs Reviewed  RPR  HIV ANTIBODY (ROUTINE TESTING W REFLEX)  CERVICOVAGINAL ANCILLARY ONLY   No empiric treatment. Will notify of any positive results. Instructed to refrain from sexual activity for at least seven days.  Reviewed expectations re: course of current medical issues. Questions answered. Outlined signs and symptoms indicating need for more acute intervention. Patient verbalized understanding. After Visit Summary given.   SUBJECTIVE:  Jasmine Walters is a 22 y.o. female who presents with complaint of vaginal discharge. Scant; few days. Requesting STD testing. Denies fever/pelvic pain. Normal urination.  OBJECTIVE:  Vitals:   01/19/23 1120  BP: 105/61  Pulse: 72  Resp: 16  Temp: 98.1 F (36.7 C)  TempSrc: Oral  SpO2: 98%    General appearance: alert, cooperative, appears stated age and no distress Lungs: unlabored respirations; speaks full sentences without difficulty Back: no CVA tenderness; FROM at waist Abdomen: soft, non-tender GU: normal external genitalia; cervix appears normal; no cervical motion tenderness; no discharge/bleeding (female chaperone present - D. Cheree Ditto CMA) Skin: warm and dry Psychological: alert and cooperative; normal mood and affect.  Labs Reviewed  RPR  HIV ANTIBODY (ROUTINE TESTING W REFLEX)  CERVICOVAGINAL ANCILLARY ONLY    No Known Allergies  History reviewed. No pertinent past medical history. Family History  Problem Relation Age of Onset    Healthy Mother    Healthy Father    Social History   Socioeconomic History   Marital status: Significant Other    Spouse name: Not on file   Number of children: Not on file   Years of education: Not on file   Highest education level: Not on file  Occupational History   Not on file  Tobacco Use   Smoking status: Former    Types: Cigars   Smokeless tobacco: Never  Vaping Use   Vaping status: Never Used  Substance and Sexual Activity   Alcohol use: Yes    Comment: occ   Drug use: Yes    Types: Marijuana   Sexual activity: Yes    Birth control/protection: None    Comment: pt states she only has sex with females  Other Topics Concern   Not on file  Social History Narrative   Not on file   Social Determinants of Health   Financial Resource Strain: Not on File (10/08/2021)   Received from [employer], [employer]    Financial Resource Strain: 0  Food Insecurity: Not on File (10/08/2021)   Received from [place], [place]   Food Insecurity    Food: 0  Transportation Needs: Not on File (10/08/2021)   Received from [employer], [employer] Needs    Transportation: 0  Physical Activity: Not on File (10/08/2021)   Received from [place], [place]   Physical Activity    Physical Activity: 0  Stress: Not on File (10/08/2021)   Received from [facility], [place]   Stress    Stress: 0  Social Connections: Not on File (10/08/2021)  Received from [place], [place]   Social Connections    Social Connections and Isolation: 0  Intimate Partner Violence: Not on file           Mardella Layman, MD 01/19/23 1420

## 2023-01-19 NOTE — ED Triage Notes (Signed)
Pt presents for vaginal discharge and would like STD-testing.

## 2023-04-07 ENCOUNTER — Ambulatory Visit
Admission: EM | Admit: 2023-04-07 | Discharge: 2023-04-07 | Disposition: A | Discharge status: Home / Self Care | Payer: Medicaid Other | Attending: Internal Medicine | Admitting: Internal Medicine

## 2023-04-07 DIAGNOSIS — Z113 Encounter for screening for infections with a predominantly sexual mode of transmission: Secondary | ICD-10-CM

## 2023-04-07 NOTE — ED Triage Notes (Signed)
Pt presents fro STD's test.

## 2023-04-07 NOTE — Discharge Instructions (Addendum)
Our nursing staff will call you with your results and treat any infection you test positive for. We are testing for HIV, syphilis, gonorrhea, chlamydia, trichomonas. Since you do not have excessive vaginal discharge, a bad vaginal odor, vaginal itching/redness/swelling we will not be testing for BV and yeast infection. If you develop those symptoms please return to our clinic for a recheck.

## 2023-04-07 NOTE — ED Provider Notes (Signed)
  [facility]ARE [facility]  Note:  This document was prepared using Conservation officer, historic buildings and may include unintentional dictation errors.  MRN: [id number] DOB: May 15, 2001  Subjective:   Jasmine Walters is a 22 y.o. female presenting for STI screen. Would HIV and RPR, vaginal cytology.  Has a new sex partner, has unprotected sex with a female.  Denies fever, n/v, abdominal pain, pelvic pain, rashes, dysuria, urinary frequency, hematuria, vaginal discharge.    No current facility-administered medications for this encounter.  Current Outpatient Medications:    ibuprofen (ADVIL) 600 MG tablet, Take 1 tablet (600 mg total) by mouth every 6 (six) hours as needed., Disp: 30 tablet, Rfl: 0   No Known Allergies  History reviewed. No pertinent past medical history.   History reviewed. No pertinent surgical history.  Family History  Problem Relation Age of Onset   Healthy Mother    Healthy Father     Social History   Tobacco Use   Smoking status: Former    Types: Cigars   Smokeless tobacco: Never  Vaping Use   Vaping status: Never Used  Substance Use Topics   Alcohol use: Yes    Comment: occ   Drug use: Yes    Types: Marijuana    ROS   Objective:   Vitals: BP 103/62 (BP Location: Right Arm)   Pulse 65   Temp 98.2 F (36.8 C) (Oral)   Resp 14   LMP  (Within Weeks) Comment: 1 week  SpO2 98%   Physical Exam Constitutional:      General: She is not in acute distress.    Appearance: Normal appearance. She is well-developed. She is not ill-appearing, toxic-appearing or diaphoretic.  HENT:     Head: Normocephalic and atraumatic.     Nose: Nose normal.     Mouth/Throat:     Mouth: Mucous membranes are moist.  Eyes:     General: No scleral icterus.       Right eye: No discharge.        Left eye: No discharge.     Extraocular Movements: Extraocular movements intact.  Cardiovascular:     Rate and Rhythm: Normal rate.  Pulmonary:     Effort:  Pulmonary effort is normal.  Skin:    General: Skin is warm and dry.  Neurological:     General: No focal deficit present.     Mental Status: She is alert and oriented to person, place, and time.  Psychiatric:        Mood and Affect: Mood normal.        Behavior: Behavior normal.     Assessment and Plan :   PDMP not reviewed this encounter.  1. Screen for STD (sexually transmitted disease)    Advised against BV and yeast infection testing as she is asymptomatic.  Patient was kindly agreeable to this.  Will update results as they come back to [place] and treat as appropriate.   Wallis Bamberg, [place] 04/07/23 [phone]

## 2023-04-08 LAB — HIV ANTIBODY (ROUTINE TESTING W REFLEX): HIV Screen 4th Generation wRfx: NONREACTIVE

## 2023-04-08 LAB — RPR: RPR Ser Ql: NONREACTIVE

## 2023-04-11 LAB — CERVICOVAGINAL ANCILLARY ONLY
Chlamydia: NEGATIVE
Comment: NEGATIVE
Comment: NEGATIVE
Comment: NORMAL
Neisseria Gonorrhea: NEGATIVE
Trichomonas: NEGATIVE

## 2023-05-02 ENCOUNTER — Ambulatory Visit
Admission: RE | Admit: 2023-05-02 | Discharge: 2023-05-02 | Disposition: A | Discharge status: Home / Self Care | Payer: Medicaid Other | Source: Ambulatory Visit | Attending: Emergency Medicine

## 2023-05-02 ENCOUNTER — Other Ambulatory Visit: Payer: Self-pay | Admitting: Emergency Medicine

## 2023-05-02 ENCOUNTER — Ambulatory Visit
Admission: RE | Admit: 2023-05-02 | Discharge: 2023-05-02 | Disposition: A | Discharge status: Home / Self Care | Payer: Medicaid Other | Source: Ambulatory Visit | Attending: Emergency Medicine | Admitting: Emergency Medicine

## 2023-05-02 DIAGNOSIS — N6310 Unspecified lump in the right breast, unspecified quadrant: Secondary | ICD-10-CM

## 2023-05-02 DIAGNOSIS — N6320 Unspecified lump in the left breast, unspecified quadrant: Secondary | ICD-10-CM

## 2023-05-14 ENCOUNTER — Ambulatory Visit
Admission: EM | Admit: 2023-05-14 | Discharge: 2023-05-14 | Disposition: A | Discharge status: Home / Self Care | Payer: Medicaid Other | Attending: Internal Medicine | Admitting: Internal Medicine

## 2023-05-14 DIAGNOSIS — N760 Acute vaginitis: Secondary | ICD-10-CM | POA: Diagnosis present

## 2023-05-14 DIAGNOSIS — Z113 Encounter for screening for infections with a predominantly sexual mode of transmission: Secondary | ICD-10-CM | POA: Diagnosis present

## 2023-05-14 NOTE — ED Provider Notes (Signed)
  [facility]  Note:  This document was prepared using Conservation officer, historic buildings and may include unintentional dictation errors.  MRN: [id number] DOB: 03-23-2001  Subjective:   Jasmine Walters is a 22 y.o. female presenting for 1 day history of vaginal discomfort and malodor.  Patient has a history of BV infections.  Has also had yeast infection.  Would like vaginal cytology but not blood work for STI screening.  No pain, urinary symptoms.  No current facility-administered medications for this encounter.  Current Outpatient Medications:    ibuprofen (ADVIL) 600 MG tablet, Take 1 tablet (600 mg total) by mouth every 6 (six) hours as needed., Disp: 30 tablet, Rfl: 0   No Known Allergies  History reviewed. No pertinent past medical history.   History reviewed. No pertinent surgical history.  Family History  Problem Relation Age of Onset   Healthy Mother    Healthy Father     Social History   Tobacco Use   Smoking status: Never   Smokeless tobacco: Never  Vaping Use   Vaping status: Every Day  Substance Use Topics   Alcohol use: Yes    Comment: occ   Drug use: Not Currently    Types: Marijuana    ROS   Objective:   Vitals: BP 112/72 (BP Location: Right Arm)   Pulse (!) 55   Temp 98.1 F (36.7 C) (Oral)   Resp 16   LMP 05/06/2023   SpO2 98%   Physical Exam Constitutional:      General: She is not in acute distress.    Appearance: Normal appearance. She is well-developed. She is not ill-appearing, toxic-appearing or diaphoretic.  HENT:     Head: Normocephalic and atraumatic.     Nose: Nose normal.     Mouth/Throat:     Mouth: Mucous membranes are moist.     Pharynx: Oropharynx is clear.  Eyes:     General: No scleral icterus.       Right eye: No discharge.        Left eye: No discharge.     Extraocular Movements: Extraocular movements intact.     Conjunctiva/sclera: Conjunctivae normal.  Cardiovascular:     Rate and  Rhythm: Normal rate.  Pulmonary:     Effort: Pulmonary effort is normal.  Abdominal:     General: Bowel sounds are normal. There is no distension.     Palpations: Abdomen is soft. There is no mass.     Tenderness: There is no abdominal tenderness. There is no right CVA tenderness, left CVA tenderness, guarding or rebound.  Skin:    General: Skin is warm and dry.  Neurological:     General: No focal deficit present.     Mental Status: She is alert and oriented to person, place, and time.  Psychiatric:        Mood and Affect: Mood normal.        Behavior: Behavior normal.        Thought Content: Thought content normal.        Judgment: Judgment normal.     Assessment and Plan :   PDMP not reviewed this encounter.  1. Acute vaginitis   2. Screen for STD (sexually transmitted disease)    Patient declined empiric treatment.  Will base treatment off of any positive results.   Wallis Bamberg, PA-C 05/14/23 1446

## 2023-05-14 NOTE — Discharge Instructions (Addendum)
We will let you know about your test results as they come back.  I expect them to be available at the latest Tuesday.

## 2023-05-14 NOTE — ED Triage Notes (Signed)
Pt c/o vaginal discomfort and odor started last night-NAD-steady gait

## 2023-05-16 ENCOUNTER — Telehealth (HOSPITAL_COMMUNITY): Payer: Self-pay | Admitting: Emergency Medicine

## 2023-05-16 LAB — CERVICOVAGINAL ANCILLARY ONLY
Bacterial Vaginitis (gardnerella): POSITIVE — AB
Candida Glabrata: NEGATIVE
Candida Vaginitis: NEGATIVE
Chlamydia: NEGATIVE
Comment: NEGATIVE
Comment: NEGATIVE
Comment: NEGATIVE
Comment: NEGATIVE
Comment: NEGATIVE
Comment: NORMAL
Neisseria Gonorrhea: NEGATIVE
Trichomonas: NEGATIVE

## 2023-05-16 MED ORDER — METRONIDAZOLE 500 MG PO TABS: 500.0000 mg | ORAL_TABLET | Freq: Two times a day (BID) | ORAL | 0 refills | Status: DC

## 2023-05-16 NOTE — Telephone Encounter (Signed)
Metronidazole for positive BV

## 2023-06-08 ENCOUNTER — Inpatient Hospital Stay (HOSPITAL_COMMUNITY)
Admission: AD | Admit: 2023-06-08 | Discharge: 2023-06-08 | Disposition: A | Discharge status: Home / Self Care | Payer: Medicaid Other | Attending: Obstetrics & Gynecology | Admitting: Obstetrics & Gynecology

## 2023-06-08 ENCOUNTER — Encounter (HOSPITAL_COMMUNITY): Payer: Self-pay | Admitting: *Deleted

## 2023-06-08 DIAGNOSIS — Z3A01 Less than 8 weeks gestation of pregnancy: Secondary | ICD-10-CM | POA: Diagnosis not present

## 2023-06-08 DIAGNOSIS — R110 Nausea: Secondary | ICD-10-CM | POA: Diagnosis present

## 2023-06-08 DIAGNOSIS — F17290 Nicotine dependence, other tobacco product, uncomplicated: Secondary | ICD-10-CM | POA: Insufficient documentation

## 2023-06-08 DIAGNOSIS — Z3201 Encounter for pregnancy test, result positive: Secondary | ICD-10-CM | POA: Diagnosis not present

## 2023-06-08 DIAGNOSIS — O99331 Smoking (tobacco) complicating pregnancy, first trimester: Secondary | ICD-10-CM | POA: Insufficient documentation

## 2023-06-08 LAB — POCT PREGNANCY, URINE: Preg Test, Ur: POSITIVE — AB

## 2023-06-08 NOTE — MAU Provider Note (Signed)
History     CSN: [id number]  Arrival date and time: 06/08/23 1232   Event Date/Time   First Provider Initiated Contact with Patient 06/08/23 1400      Chief Complaint  Patient presents with   Possible Pregnancy   Nausea   HPI Patient presenting for evaluation after positive pregnancy test at home.  She took 2 pregnancy test yesterday and 2 this morning and they were all positive.  Patient reports LMP was around 05/06/2023.  Wanted to make sure she is pregnant and figure out dating.  Also has questions regarding if she decides that she wants to terminate pregnancy what her options and where can she go.  Denies any abdominal pain, bleeding, gush of fluid.  Denies any headaches or blurry vision.  Reports breast tenderness and acne but no other concerns.  OB History     Gravida  1   Para      Term      Preterm      AB      Living         SAB      IAB      Ectopic      Multiple      Live Births              No past medical history on file.  No past surgical history on file.  Family History  Problem Relation Age of Onset   Healthy Mother    Healthy Father     Social History   Tobacco Use   Smoking status: Never   Smokeless tobacco: Never  Vaping Use   Vaping status: Every Day  Substance Use Topics   Alcohol use: Yes    Comment: occ   Drug use: Not Currently    Types: Marijuana    Allergies: No Known Allergies  Medications Prior to Admission  Medication Sig Dispense Refill Last Dose/Taking   ibuprofen (ADVIL) 600 MG tablet Take 1 tablet (600 mg total) by mouth every 6 (six) hours as needed. 30 tablet 0    metroNIDAZOLE (FLAGYL) 500 MG tablet Take 1 tablet (500 mg total) by mouth 2 (two) times daily. 14 tablet 0     Review of Systems  Constitutional:  Negative for chills and fever.  HENT:  Negative for congestion.   Eyes:  Negative for visual disturbance.  Respiratory:  Negative for shortness of breath.   Cardiovascular:  Negative for  chest pain.  Gastrointestinal:  Negative for abdominal pain, constipation, diarrhea and nausea.  Endocrine: Negative for polyuria.  Genitourinary:  Negative for vaginal bleeding and vaginal discharge.   Physical Exam   Blood pressure (!) 106/46, pulse 67, temperature 99 F (37.2 C), temperature source Oral, resp. rate 16, height 4\' 10"  (1.473 m), weight 50.7 kg, last menstrual period 05/06/2023, SpO2 100%.  Physical Exam Vitals reviewed.  Constitutional:      Appearance: Normal appearance.  HENT:     Head: Normocephalic and atraumatic.     Nose: Nose normal.  Eyes:     Pupils: Pupils are equal, round, and reactive to light.  Cardiovascular:     Rate and Rhythm: Normal rate.  Pulmonary:     Effort: Pulmonary effort is normal.  Abdominal:     General: Abdomen is flat. There is no distension.     Palpations: Abdomen is soft.  Musculoskeletal:        General: Normal range of motion.     Cervical back: Normal range of  motion.  Skin:    General: Skin is warm.     Capillary Refill: Capillary refill takes less than 2 seconds.  Neurological:     General: No focal deficit present.     Mental Status: She is alert.     MAU Course  Procedures  MDM Urine pregnancy test  Assessment and Plan  Jasmine Walters is a 22 yo G1 @ [redacted]w[redacted]d based on approximate LMP here to confirm pregnancy.  Pregnancy confirmation Patient positive urine pregnancy test.  Urine pregnancy test here is also positive.  Reports that her last menses was around 05/06/2023 making her approximately 4 weeks and 6 days.  Provided information on establishing care with OB/GYN provider.  Also provided information on termination options if that is the decision she makes.  Answered any questions he had.  Patient discharged home with return precautions.  Celedonio Savage 06/08/2023, 2:16 PM

## 2023-06-08 NOTE — MAU Note (Addendum)
Jasmine Walters is a 22 y.o. at Unknown here in MAU reporting: 2+HPT last night. 2 more +HPT this morning. Hasn't got period. Boobs seem a little bigger.  Feels a little funny, nausea after eating.  Feels fine right now, a little hungry.  No pain or bleeding.  LMP: 11/15/ Onset of complaint: none  Vitals:   06/08/23 1335  BP: (!) 106/46  Pulse: 67  Resp: 16  Temp: 99 F (37.2 C)  SpO2: 100%      Lab orders placed from triage:  upt   Questions: doesn't know how far a long she is, unsure what her plans are.Marland Kitchen...just found out

## 2023-06-08 NOTE — Discharge Instructions (Addendum)
It was a pleasure taking care of you today.  Below is some information on healthcare providers in the area.  If you decide to proceed with the pregnancy please contact one of them to initiate prenatal care.  There is also information on termination providers in the area.  This is a woman's choice and [facility] and their information is below as well.  If you have any bleeding or issues please return for evaluation.  I hope you have a wonderful day!  [facility] Address: [address] [place] [address] Hours: Monday - Friday 8am-4pm & Saturday 8am-12pm Phone: [phone] [email or web address]   [facility] Address: [address] [place]. [address] [place], [place] [address] Hours:  Monday 9AM-5PM Tuesday 10AM-6PM Wednesday 11AM-7PM Thursday 9AM-5PM Friday              8AM-2PM Closed on the weekends Phone: [phone]  Can also contact [facility] for financial assistance: Phone: [phone] [place] [place] [facility] Providers   Center for [employer] [facility] [employer]             [address] [place], [place] [address] [phone]  Center for [facility] at [facility]                                                             [address], [address], [place], [place], [address] [phone]  Center for [facility] at [facility] [address], [place], [place], [address] [phone]  Center for [facility] at [facility] [address], [place], [place], [address] [phone]  Center for [facility] at [facility]                                 [address] [place], [place], [place], [address] [phone]  Center for [facility] at [facility]                                    [address], [place], [place], [address] [phone]  Center for [facility] at [facility] [address] [place], [place], [address]                              [facility] [address], [place], [place], [address] [phone]  [facility]         Phone: [phone]  [facility]      Phone: [phone]   [facility]      Phone: [phone]  [facility]         Phone: [phone]   [facility]-M[facility]    Phone: [phone]  [facility]      Phone: [phone]  [facility]  [facility]     Phone: [phone]  [facility]        Phone: [phone]  [facility] ([address] [place]) [phone]  [facility]      Phone: [phone]

## 2023-10-15 ENCOUNTER — Ambulatory Visit
Admission: EM | Admit: 2023-10-15 | Discharge: 2023-10-15 | Disposition: A | Discharge status: Home / Self Care | Attending: Urgent Care | Admitting: Urgent Care

## 2023-10-15 DIAGNOSIS — N949 Unspecified condition associated with female genital organs and menstrual cycle: Secondary | ICD-10-CM | POA: Diagnosis present

## 2023-10-15 NOTE — ED Provider Notes (Signed)
 [facility]  Note:  This document was prepared using Conservation officer, historic buildings and may include unintentional dictation errors.  MRN: [id number] DOB: February 26, 2001  Subjective:   Jasmine Walters is a 23 y.o. female presenting for 1 day history of feeling bumps on the inner part of her vagina.  Patient reports that she had a procedural abortion 10 days ago.  Has felt fine since then.  Denies fever, n/v, abdominal pain, pelvic pain, rashes, dysuria, urinary frequency, hematuria, vaginal discharge.  The only reason patient felt the particular bump she felt is because she normally uses her index and middle finger to clean the inside of her vagina while showering.   No current facility-administered medications for this encounter.  Current Outpatient Medications:    ibuprofen  (ADVIL ) 600 MG tablet, Take 1 tablet (600 mg total) by mouth every 6 (six) hours as needed., Disp: 30 tablet, Rfl: 0   metroNIDAZOLE  (FLAGYL ) 500 MG tablet, Take 1 tablet (500 mg total) by mouth 2 (two) times daily., Disp: 14 tablet, Rfl: 0   No Known Allergies  History reviewed. No pertinent past medical history.   History reviewed. No pertinent surgical history.  Family History  Problem Relation Age of Onset   Healthy Mother    Healthy Father     Social History   Tobacco Use   Smoking status: Never   Smokeless tobacco: Never  Vaping Use   Vaping status: Every Day  Substance Use Topics   Alcohol use: Yes    Comment: occ   Drug use: Not Currently    Types: Marijuana    ROS   Objective:   Vitals: BP 102/68 (BP Location: Right Arm)   Pulse (!) 57   Temp 98.4 F (36.9 C) (Oral)   Resp 16   LMP 08/06/2023 (Approximate)   SpO2 98%   Breastfeeding No   Physical Exam Exam conducted with a chaperone present (RN M. Nolon Baxter).  Constitutional:      General: She is not in acute distress.    Appearance: Normal appearance. She is well-developed. She is not ill-appearing,  toxic-appearing or diaphoretic.  HENT:     Head: Normocephalic and atraumatic.     Nose: Nose normal.     Mouth/Throat:     Mouth: Mucous membranes are moist.     Pharynx: Oropharynx is clear.  Eyes:     General: No scleral icterus.       Right eye: No discharge.        Left eye: No discharge.     Extraocular Movements: Extraocular movements intact.     Conjunctiva/sclera: Conjunctivae normal.  Cardiovascular:     Rate and Rhythm: Normal rate.  Pulmonary:     Effort: Pulmonary effort is normal.  Abdominal:     General: Bowel sounds are normal. There is no distension.     Palpations: Abdomen is soft. There is no mass.     Tenderness: There is no abdominal tenderness. There is no right CVA tenderness, left CVA tenderness, guarding or rebound.     Hernia: There is no hernia in the left inguinal area or right inguinal area.  Genitourinary:    Exam position: Supine.     Pubic Area: No rash.      Labia:        Right: No rash, tenderness, lesion or injury.        Left: No rash, tenderness, lesion or injury.      Urethra: No prolapse, urethral pain,  urethral swelling or urethral lesion.     Vagina: No signs of injury and foreign body. No vaginal discharge, erythema, tenderness, bleeding, lesions or prolapsed vaginal walls.     Cervix: Discharge present. No cervical motion tenderness, friability, lesion, erythema, cervical bleeding or eversion.     Uterus: Not deviated, not enlarged, not fixed, not tender and no uterine prolapse.      Adnexa:        Right: No mass, tenderness or fullness.         Left: No mass, tenderness or fullness.    Lymphadenopathy:     Lower Body: No right inguinal adenopathy. No left inguinal adenopathy.  Skin:    General: Skin is warm and dry.  Neurological:     General: No focal deficit present.     Mental Status: She is alert and oriented to person, place, and time.  Psychiatric:        Mood and Affect: Mood normal.        Behavior: Behavior normal.         Thought Content: Thought content normal.        Judgment: Judgment normal.      Assessment and Plan :   PDMP not reviewed this encounter.  1. Vaginal discomfort    Vaginal cytology obtained.  Recommended follow-up with a gynecologist for consideration of Pap smear, biopsy as deemed appropriate.  Otherwise, patient has an unremarkable pelvic exam.  Counseled patient on potential for adverse effects with medications prescribed/recommended today, ER and return-to-clinic precautions discussed, patient verbalized understanding.    Adolph Hoop, [place] 10/15/23 1208

## 2023-10-15 NOTE — ED Triage Notes (Signed)
 Pt states she had an abortion 10 days ago.  States this morning she feels some bumps inside her vagina. Pt denies any pain or vaginal bleeding.

## 2023-10-15 NOTE — Discharge Instructions (Signed)
 Establish care with a new gynecologist listed. For now avoid anything extra or invasive to the vaginal canal. Practice general hygiene and avoid any new soaps. Hydrate well, avoid urogenital irritants like soda, sweet tea, a lot off caffeine or alcohol. We will let you know about your vaginal swab results on Monday.

## 2023-10-17 LAB — CERVICOVAGINAL ANCILLARY ONLY
Bacterial Vaginitis (gardnerella): NEGATIVE
Candida Glabrata: NEGATIVE
Candida Vaginitis: NEGATIVE
Chlamydia: NEGATIVE
Comment: NEGATIVE
Comment: NEGATIVE
Comment: NEGATIVE
Comment: NEGATIVE
Comment: NEGATIVE
Comment: NORMAL
Neisseria Gonorrhea: NEGATIVE
Trichomonas: NEGATIVE

## 2023-10-31 ENCOUNTER — Ambulatory Visit
Admission: RE | Admit: 2023-10-31 | Discharge: 2023-10-31 | Disposition: A | Discharge status: Home / Self Care | Payer: Medicaid Other | Source: Ambulatory Visit | Attending: Emergency Medicine

## 2023-10-31 ENCOUNTER — Ambulatory Visit
Admission: RE | Admit: 2023-10-31 | Discharge: 2023-10-31 | Disposition: A | Discharge status: Home / Self Care | Payer: Medicaid Other | Source: Ambulatory Visit | Attending: Emergency Medicine | Admitting: Emergency Medicine

## 2023-10-31 DIAGNOSIS — N6320 Unspecified lump in the left breast, unspecified quadrant: Secondary | ICD-10-CM

## 2023-10-31 DIAGNOSIS — N6310 Unspecified lump in the right breast, unspecified quadrant: Secondary | ICD-10-CM

## 2024-05-01 ENCOUNTER — Emergency Department (HOSPITAL_COMMUNITY)

## 2024-05-01 ENCOUNTER — Emergency Department (HOSPITAL_COMMUNITY)
Admission: EM | Admit: 2024-05-01 | Discharge: 2024-05-01 | Disposition: A | Discharge status: Home / Self Care | Attending: Emergency Medicine | Admitting: Emergency Medicine

## 2024-05-01 ENCOUNTER — Other Ambulatory Visit: Payer: Self-pay

## 2024-05-01 DIAGNOSIS — R059 Cough, unspecified: Secondary | ICD-10-CM | POA: Diagnosis present

## 2024-05-01 DIAGNOSIS — J069 Acute upper respiratory infection, unspecified: Secondary | ICD-10-CM | POA: Insufficient documentation

## 2024-05-01 LAB — RESP PANEL BY RT-PCR (RSV, FLU A&B, COVID)  RVPGX2
Influenza A by PCR: NEGATIVE
Influenza B by PCR: NEGATIVE
Resp Syncytial Virus by PCR: NEGATIVE
SARS Coronavirus 2 by RT PCR: NEGATIVE

## 2024-05-01 LAB — GROUP A STREP BY PCR: Group A Strep by PCR: NOT DETECTED

## 2024-05-01 MED ORDER — ACETAMINOPHEN 325 MG PO TABS: 650.0000 mg | ORAL_TABLET | Freq: Once | ORAL | Status: DC | PRN

## 2024-05-01 MED ORDER — ACETAMINOPHEN 160 MG/5ML PO SOLN
650.0000 mg | Freq: Once | ORAL | Status: AC
  Administered 2024-05-01: 650 mg via ORAL
  Filled 2024-05-01: qty 20.3

## 2024-05-01 NOTE — ED Provider Notes (Signed)
 [place] [facility] DE[facility]MENT AT [facility] Provider Note   CSN: [id number] Arrival date & time: 05/01/24  8070     Patient presents with: Shortness of Breath, Sore Throat, and Nasal Congestion   Nehemiah Montee is a 23 y.o. female.   23 year old female here today for sore throat runny nose and cough.  Symptoms again yesterday.  She is not taking over-the-counter medications.  Patient otherwise healthy.   Shortness of Breath Sore Throat Associated symptoms include shortness of breath.       Prior to Admission medications   Medication Sig Start Date End Date Taking? Authorizing Provider  ibuprofen  (ADVIL ) 600 MG tablet Take 1 tablet (600 mg total) by mouth every 6 (six) hours as needed. 12/25/22   Christopher Savannah, PA-C  cetirizine  (ZYRTEC  ALLERGY) 10 MG tablet Take 1 tablet (10 mg total) by mouth daily. 10/22/19 03/02/20  Christopher Savannah, PA-C  famotidine  (PEPCID ) 20 MG tablet Take bid for 3 days then as needed thereafter for heartburn 04/18/18 04/25/19  Susy Pierce, MD  omeprazole  (PRILOSEC) 20 MG capsule Take 1 capsule (20 mg total) by mouth daily. 09/07/17 04/25/19  Keith Sor, PA-C    Allergies: Patient has no known allergies.    Review of Systems  Respiratory:  Positive for shortness of breath.     Updated Vital Signs BP 128/80   Pulse (!) 55   Temp 98.5 F (36.9 C) (Oral)   Resp 18   LMP 05/04/2023   SpO2 98%   Physical Exam Vitals and nursing note reviewed.  Constitutional:      Appearance: She is not ill-appearing.  HENT:     Mouth/Throat:     Pharynx: No pharyngeal swelling or oropharyngeal exudate.  Cardiovascular:     Rate and Rhythm: Normal rate.  Pulmonary:     Effort: Pulmonary effort is normal. No tachypnea or accessory muscle usage.     Breath sounds: No decreased breath sounds or wheezing.  Abdominal:     Palpations: Abdomen is soft.  Musculoskeletal:     Cervical back: Normal range of motion.     Right lower leg: No edema.     Left lower  leg: No edema.  Skin:    General: Skin is warm.  Neurological:     General: No focal deficit present.     Mental Status: She is alert.  Psychiatric:        Mood and Affect: Mood normal.     (all labs ordered are listed, but only abnormal results are displayed) Labs Reviewed  GROUP A STREP BY PCR  RESP PANEL BY RT-PCR (RSV, FLU A&B, COVID)  [id number]    EKG: EKG Interpretation Date/Time:  Tuesday May 01 2024 19:43:35 EST Ventricular Rate:  52 PR Interval:  153 QRS Duration:  101 QT Interval:  413 QTC Calculation: 384 R Axis:   90  Text Interpretation: Sinus rhythm Atrial premature complex Borderline right axis deviation Confirmed by Mannie Pac [phone]) on 05/01/2024 9:49:04 PM  Radiology: DG Chest 2 View Result Date: 05/01/2024 EXAM: 2 VIEW(S) XRAY OF THE CHEST 05/01/2024 08:09:00 PM COMPARISON: 09/07/2017 CLINICAL HISTORY: sob FINDINGS: LUNGS AND PLEURA: No focal pulmonary opacity. No pulmonary edema. No pleural effusion. No pneumothorax. HEART AND MEDIASTINUM: No acute abnormality of the cardiac and mediastinal silhouettes. BONES AND SOFT TISSUES: No acute osseous abnormality. IMPRESSION: 1. No acute cardiopulmonary process. Electronically signed by: Franky Crease MD 05/01/2024 08:12 PM EST RP Workstation: [id number]     Procedures   Medications Ordered  in the ED  acetaminophen  (TYLENOL ) 160 MG/5ML solution 650 mg (650 mg Oral Given 05/01/24 2019)                                    Medical Decision Making 23 year old female here today with URI symptoms.  Plan -well-appearing 23 year old female.  She has URI symptoms.  Vital signs are normal.  Lung sounds clear, uvula midline.  She received some Tylenol , afebrile here.  Independently the patient's chest x-ray shows no pneumonia.  Patient appropriate for discharge.  Amount and/or Complexity of Data Reviewed Radiology: ordered.  Risk OTC drugs.        Final diagnoses:  Viral upper respiratory tract  infection    ED Discharge Orders     None          Mannie Fairy DASEN, DO 05/01/24 2149

## 2024-05-01 NOTE — ED Triage Notes (Signed)
 PT with burning throat, congestion, unable to taste, which started yesterday. Taken day/night quil, theraflu and nothings has helped. No appetite.

## 2024-06-18 ENCOUNTER — Ambulatory Visit
Admission: EM | Admit: 2024-06-18 | Discharge: 2024-06-18 | Disposition: A | Discharge status: Home / Self Care | Attending: Family Medicine | Admitting: Family Medicine

## 2024-06-18 DIAGNOSIS — Z113 Encounter for screening for infections with a predominantly sexual mode of transmission: Secondary | ICD-10-CM | POA: Diagnosis not present

## 2024-06-18 NOTE — ED Triage Notes (Addendum)
 Pt coming in today for possible exposure to STD. Pt denies any symptoms. Has a new partner and wants to make sure there is no exposure. Would like swab and blood work today.

## 2024-06-18 NOTE — Discharge Instructions (Signed)
 Your test results should be available tomorrow.  Our results team will let you know if you need any kind of treatment for any positive test results.

## 2024-06-18 NOTE — ED Provider Notes (Signed)
" °  [facility]  Note:  This document was prepared using Conservation officer, historic buildings and may include unintentional dictation errors.  MRN: [id number] DOB: 11-14-2000  Subjective:   Jasmine Walters is a 23 y.o. female presenting for STI check. Denies fever, n/v, abdominal pain, pelvic pain, rashes, dysuria, urinary frequency, hematuria, vaginal discharge.  Patient was possibly exposed to an STI as her previous sex partner had other women he was potentially having sex with.   Current Outpatient Medications  Medication Instructions   ibuprofen  (ADVIL ) 600 mg, Oral, Every 6 hours PRN    Allergies[1]  History reviewed. No pertinent past medical history.   History reviewed. No pertinent surgical history.  Family History  Problem Relation Age of Onset   Healthy Mother    Healthy Father     Social History   Occupational History   Not on file  Tobacco Use   Smoking status: Never   Smokeless tobacco: Never  Vaping Use   Vaping status: Every Day  Substance and Sexual Activity   Alcohol use: Yes    Comment: occ   Drug use: Not Currently    Types: Marijuana   Sexual activity: Yes    Birth control/protection: None     ROS   Objective:   Vitals: BP 98/63 (BP Location: Right Arm)   Pulse 60   Temp 98 F (36.7 C) (Oral)   Resp 17   LMP 06/15/2024 (Exact Date)   SpO2 96%   Physical Exam Constitutional:      General: She is not in acute distress.    Appearance: Normal appearance. She is well-developed. She is not ill-appearing, toxic-appearing or diaphoretic.  HENT:     Head: Normocephalic and atraumatic.     Nose: Nose normal.     Mouth/Throat:     Mouth: Mucous membranes are moist.  Eyes:     General: No scleral icterus.       Right eye: No discharge.        Left eye: No discharge.     Extraocular Movements: Extraocular movements intact.  Cardiovascular:     Rate and Rhythm: Normal rate.  Pulmonary:     Effort: Pulmonary effort is  normal.  Skin:    General: Skin is warm and dry.  Neurological:     General: No focal deficit present.     Mental Status: She is alert and oriented to person, place, and time.  Psychiatric:        Mood and Affect: Mood normal.        Behavior: Behavior normal.     Assessment and Plan :   PDMP not reviewed this encounter.  1. Routine screening for STI (sexually transmitted infection)    Will treat based off of lab results.    [1] No Known Allergies    Christopher Savannah, [place] 06/18/24 [id number]  "

## 2024-06-19 LAB — HIV ANTIBODY (ROUTINE TESTING W REFLEX): HIV Screen 4th Generation wRfx: NONREACTIVE

## 2024-06-19 LAB — SYPHILIS: RPR W/REFLEX TO RPR TITER AND TREPONEMAL ANTIBODIES, TRADITIONAL SCREENING AND DIAGNOSIS ALGORITHM: RPR Ser Ql: NONREACTIVE
